# Patient Record
Sex: Male | Born: 1960 | Race: White | Hispanic: No | Smoking: Former smoker
Health system: Southern US, Community
[De-identification: ages and names within clinical notes are randomized; demographics above are authoritative.]

## PROBLEM LIST (undated history)

## (undated) DIAGNOSIS — B1920 Unspecified viral hepatitis C without hepatic coma: Secondary | ICD-10-CM

## (undated) DIAGNOSIS — T7840XA Allergy, unspecified, initial encounter: Secondary | ICD-10-CM

## (undated) DIAGNOSIS — G4730 Sleep apnea, unspecified: Secondary | ICD-10-CM

## (undated) DIAGNOSIS — G709 Myoneural disorder, unspecified: Secondary | ICD-10-CM

## (undated) HISTORY — DX: Sleep apnea, unspecified: G47.30

## (undated) HISTORY — PX: HAND SURGERY: SHX662

## (undated) HISTORY — DX: Unspecified viral hepatitis C without hepatic coma: B19.20

## (undated) HISTORY — DX: Allergy, unspecified, initial encounter: T78.40XA

## (undated) HISTORY — DX: Myoneural disorder, unspecified: G70.9

---

## 2000-03-02 ENCOUNTER — Encounter (HOSPITAL_COMMUNITY): Admission: RE | Admit: 2000-03-02 | Discharge: 2000-05-31 | Payer: Self-pay | Admitting: Neurology

## 2010-06-21 ENCOUNTER — Emergency Department (HOSPITAL_COMMUNITY): Admission: EM | Admit: 2010-06-21 | Discharge: 2010-06-21 | Payer: Self-pay | Admitting: Emergency Medicine

## 2011-09-15 ENCOUNTER — Other Ambulatory Visit: Payer: Self-pay | Admitting: Family Medicine

## 2011-09-15 DIAGNOSIS — R748 Abnormal levels of other serum enzymes: Secondary | ICD-10-CM

## 2011-09-23 ENCOUNTER — Ambulatory Visit
Admission: RE | Admit: 2011-09-23 | Discharge: 2011-09-23 | Disposition: A | Discharge status: Home / Self Care | Payer: Commercial Managed Care - PPO | Source: Ambulatory Visit | Attending: Family Medicine | Admitting: Family Medicine

## 2011-09-23 DIAGNOSIS — R748 Abnormal levels of other serum enzymes: Secondary | ICD-10-CM

## 2012-07-06 ENCOUNTER — Ambulatory Visit: Payer: Commercial Managed Care - PPO | Admitting: Gastroenterology

## 2020-11-10 ENCOUNTER — Ambulatory Visit: Payer: Self-pay | Admitting: Family Medicine

## 2021-01-08 ENCOUNTER — Ambulatory Visit: Payer: Commercial Managed Care - PPO | Admitting: Adult Health

## 2021-01-08 ENCOUNTER — Other Ambulatory Visit: Payer: Self-pay

## 2021-01-08 ENCOUNTER — Encounter: Payer: Self-pay | Admitting: Adult Health

## 2021-01-08 VITALS — BP 126/86 | HR 82 | Temp 98.0°F | Resp 16 | Ht 67.0 in | Wt 199.2 lb

## 2021-01-08 DIAGNOSIS — R202 Paresthesia of skin: Secondary | ICD-10-CM

## 2021-01-08 DIAGNOSIS — Z6831 Body mass index (BMI) 31.0-31.9, adult: Secondary | ICD-10-CM | POA: Diagnosis not present

## 2021-01-08 DIAGNOSIS — Z0000 Encounter for general adult medical examination without abnormal findings: Secondary | ICD-10-CM | POA: Insufficient documentation

## 2021-01-08 DIAGNOSIS — Z125 Encounter for screening for malignant neoplasm of prostate: Secondary | ICD-10-CM

## 2021-01-08 DIAGNOSIS — J302 Other seasonal allergic rhinitis: Secondary | ICD-10-CM | POA: Diagnosis not present

## 2021-01-08 DIAGNOSIS — R0683 Snoring: Secondary | ICD-10-CM | POA: Diagnosis not present

## 2021-01-08 DIAGNOSIS — E669 Obesity, unspecified: Secondary | ICD-10-CM | POA: Insufficient documentation

## 2021-01-08 MED ORDER — FLUTICASONE PROPIONATE 50 MCG/ACT NA SUSP: 2.0000 | Freq: Every day | NASAL | 6 refills | Status: DC

## 2021-01-08 MED ORDER — LEVOCETIRIZINE DIHYDROCHLORIDE 5 MG PO TABS: 5.0000 mg | ORAL_TABLET | Freq: Every evening | ORAL | 1 refills | Status: DC

## 2021-01-08 MED ORDER — AZELASTINE HCL 0.1 % NA SOLN: 2.0000 | Freq: Two times a day (BID) | NASAL | 12 refills | Status: DC

## 2021-01-08 NOTE — Progress Notes (Signed)
New patient visit   Patient: Gordon Moore   DOB: 12-31-1960   60 y.o. Male  MRN: [id number] Visit Date: 01/08/2021  Today's healthcare provider: Jairo Ben, FNP   Chief Complaint  Patient presents with  . New Patient (Initial Visit)   Subjective    Gordon Moore is a 60 y.o. male who presents today as a new patient to establish care.  HPI  Patient presents in office today to establish care he states that he feels fairly well today but does have concerns to address. Patient reports that he has concerns of a deviated septum and would like to discuss.   Patient states that he follows a general diet, he is not actively exercising at this time.   He feels he may have issues with deviated septum, says this has happened slowly over years. He may have had his nose broken as a young guy. He takes Claritin occasionally, Flonase occasionally.   Denies any recurrent sinus infections. He feels like in a " way I always have a cold" sinus congestion. Patient reports that his sleep habits are very poor, patient states that his girlfriend has told him multiple times that she has seen him stop breathing in the middle of the night, patient states that he has concerns of sleep apnea. Has low energy during the day. No unintentional sleep during the day.   Drinks caffeine - 4-5 cups daily.   Patient would also like to address today concerns of circulation in his lower extremities, patient states that if he sits down for a prolonged time he initially experiences pain when standing. After a few steps he starts walking normal. Denies any bone pain. He sleeps in all different positions.   He wants to loose weight.  He is taking Viagra, he reports he has had erectile dysfunction for years. He may want to see urologist later. He will let provider know.   History of smoking, quit one year ago. He does still vape, - nicotine.   In past he reports he was tested for possible MS, he saw a  neurologist.   Patient  denies any fever, body aches,chills, rash, chest pain, shortness of breath, nausea, vomiting, or diarrhea.  Denies dizziness, lightheadedness, pre syncopal or syncopal episodes.    Past Medical History:  Diagnosis Date  . Allergy    History reviewed. No pertinent surgical history. Family Status  Relation Name Status  . Mother  (Not Specified)  . Father  (Not Specified)   Family History  Problem Relation Age of Onset  . Other Mother        patient reported mother had hip replacement  . Other Father        patient wrote that father had kidney issues   Social History   Socioeconomic History  . Marital status: Married    Spouse name: Not on file  . Number of children: Not on file  . Years of education: Not on file  . Highest education level: Not on file  Occupational History  . Not on file  Tobacco Use  . Smoking status: Never Smoker  . Smokeless tobacco: Current User  Substance and Sexual Activity  . Alcohol use: Yes    Alcohol/week: 5.0 standard drinks    Types: 4 Glasses of wine, 1 Cans of beer per week  . Drug use: Never  . Sexual activity: Not on file  Other Topics Concern  . Not on file  Social History Narrative  . Not  on file   Social Determinants of Health   Financial Resource Strain: Not on file  Food Insecurity: Not on file  Transportation Needs: Not on file  Physical Activity: Not on file  Stress: Not on file  Social Connections: Not on file   No outpatient medications prior to visit.   No facility-administered medications prior to visit.   Allergies  Allergen Reactions  . Other     Patient reports seasonal allergies.     There is no immunization history on file for this patient.  Health Maintenance  Topic Date Due  . COVID-19 Vaccine (1) 01/24/2021 (Originally 05/05/1966)  . INFLUENZA VACCINE  02/24/2021 (Originally 05/25/2020)  . COLONOSCOPY (Pts 45-22yrs Insurance coverage will need to be confirmed)  01/08/2022  (Originally 05/05/2008)  . TETANUS/TDAP  01/08/2022 (Originally 05/05/1982)  . Hepatitis C Screening  01/08/2022 (Originally 13-Jan-1961)  . HIV Screening  01/08/2022 (Originally 05/05/1978)  . HPV VACCINES  Aged Out    Patient Care Team: Can Lucci, Eula Fried, FNP as PCP - General (Family Medicine)  Review of Systems  Constitutional: Negative for activity change, appetite change, chills, diaphoresis, fatigue, fever and unexpected weight change.  HENT: Positive for congestion, drooling, rhinorrhea, sinus pressure, sneezing and sore throat. Negative for dental problem, ear discharge, ear pain, facial swelling, hearing loss, mouth sores, nosebleeds, postnasal drip, sinus pain, tinnitus, trouble swallowing and voice change.   Eyes: Positive for discharge and itching.  Respiratory: Positive for apnea, shortness of breath and wheezing. Negative for cough, choking, chest tightness and stridor.   Cardiovascular: Negative.   Gastrointestinal: Positive for nausea. Negative for abdominal distention, abdominal pain, anal bleeding, blood in stool and constipation.  Genitourinary: Positive for frequency. Negative for decreased urine volume, difficulty urinating, dysuria, enuresis, flank pain, genital sores, hematuria, penile discharge, penile pain, penile swelling, scrotal swelling, testicular pain and urgency.  Musculoskeletal: Positive for myalgias. Negative for arthralgias, back pain, gait problem, joint swelling, neck pain and neck stiffness.  Skin: Negative.   Neurological: Positive for numbness. Negative for dizziness, tremors, seizures, syncope, facial asymmetry, speech difficulty, weakness, light-headedness (in bilateral legs at times when waking up or after sitting prolonged periods of time. resolves after being up a few seconds. denies any back pain or bone pain. ) and headaches.  Hematological: Negative.   Psychiatric/Behavioral: Negative.   All other systems reviewed and are negative.   Last  CBC No results found for: WBC, HGB, HCT, MCV, MCH, RDW, PLT Last metabolic panel No results found for: GLUCOSE, NA, K, CL, CO2, BUN, CREATININE, GFRNONAA, GFRAA, CALCIUM, PHOS, PROT, ALBUMIN, LABGLOB, AGRATIO, BILITOT, ALKPHOS, AST, ALT, ANIONGAP Last lipids No results found for: CHOL, HDL, LDLCALC, LDLDIRECT, TRIG, CHOLHDL Last hemoglobin A1c No results found for: HGBA1C Last thyroid functions No results found for: TSH, T3TOTAL, T4TOTAL, THYROIDAB Last vitamin D No results found for: 25OHVITD2, 25OHVITD3, VD25OH Last vitamin B12 and Folate No results found for: VITAMINB12, FOLATE    Objective    BP 126/86   Pulse 82   Temp 98 F (36.7 C) (Oral)   Resp 16   Ht 5\' 7"  (1.702 m)   Wt 199 lb 3.2 oz (90.4 kg)   SpO2 98%   BMI 31.20 kg/m  Physical Exam Vitals and nursing note reviewed.  Constitutional:      General: He is not in acute distress.    Appearance: Normal appearance. He is well-developed. He is not ill-appearing, toxic-appearing or diaphoretic.     Comments: Patient is alert and oriented and responsive  to questions Engages in eye contact with provider. Speaks in full sentences without any pauses without any shortness of breath or distress.    HENT:     Head: Normocephalic and atraumatic.     Right Ear: Hearing, tympanic membrane, ear canal and external ear normal.     Left Ear: Hearing, tympanic membrane, ear canal and external ear normal.     Nose: Nose normal.     Mouth/Throat:     Pharynx: Uvula midline. No oropharyngeal exudate.  Eyes:     General: Lids are normal. No scleral icterus.       Right eye: No discharge.        Left eye: No discharge.     Conjunctiva/sclera: Conjunctivae normal.     Pupils: Pupils are equal, round, and reactive to light.  Neck:     Thyroid: No thyromegaly.     Vascular: Normal carotid pulses. No carotid bruit, hepatojugular reflux or JVD.     Trachea: Trachea and phonation normal. No tracheal tenderness or tracheal deviation.      Meningeal: Brudzinski's sign absent.  Cardiovascular:     Rate and Rhythm: Normal rate and regular rhythm.     Pulses: Normal pulses.     Heart sounds: Normal heart sounds, S1 normal and S2 normal. Heart sounds not distant. No murmur heard. No friction rub. No gallop.   Pulmonary:     Effort: Pulmonary effort is normal. No accessory muscle usage or respiratory distress.     Breath sounds: Normal breath sounds. No stridor. No wheezing or rales.  Chest:     Chest wall: No tenderness.  Abdominal:     General: Bowel sounds are normal. There is no distension.     Palpations: Abdomen is soft. There is no mass.     Tenderness: There is no abdominal tenderness. There is no right CVA tenderness, left CVA tenderness, guarding or rebound.     Hernia: No hernia is present.  Musculoskeletal:        General: No tenderness or deformity. Normal range of motion.     Cervical back: Full passive range of motion without pain, normal range of motion and neck supple.     Comments: Patient moves on and off of exam table and in room without difficulty. Gait is normal in hall and in room. Patient is oriented to person place time and situation. Patient answers questions appropriately and engages in conversation.   Lymphadenopathy:     Head:     Right side of head: No submental, submandibular, tonsillar, preauricular, posterior auricular or occipital adenopathy.     Left side of head: No submental, submandibular, tonsillar, preauricular, posterior auricular or occipital adenopathy.     Cervical: No cervical adenopathy.  Skin:    General: Skin is warm and dry.     Capillary Refill: Capillary refill takes less than 2 seconds.     Coloration: Skin is not pale.     Findings: No erythema or rash.     Nails: There is no clubbing.  Neurological:     General: No focal deficit present.     Mental Status: He is alert and oriented to person, place, and time.     GCS: GCS eye subscore is 4. GCS verbal subscore is 5. GCS  motor subscore is 6.     Cranial Nerves: No cranial nerve deficit.     Sensory: No sensory deficit.     Motor: No weakness or abnormal muscle tone.  Coordination: Coordination normal.     Gait: Gait normal.     Deep Tendon Reflexes: Reflexes are normal and symmetric. Reflexes normal.  Psychiatric:        Mood and Affect: Mood normal.        Speech: Speech normal.        Behavior: Behavior normal.        Thought Content: Thought content normal.        Judgment: Judgment normal.     Depression Screen PHQ 2/9 Scores 01/08/2021  PHQ - 2 Score 1  PHQ- 9 Score 6   No results found for any visits on 01/08/21.  Assessment & Plan     Loud snoring - Plan: CBC with Differential/Platelet, Comprehensive metabolic panel, Lipid panel, TSH, Ambulatory referral to Sleep Studies, Ambulatory referral to ENT  Body mass index (BMI) of 31.0-31.9 in adult - Plan: CBC with Differential/Platelet, Comprehensive metabolic panel, Lipid panel, TSH, PSA  Paresthesia of bilateral legs - Plan: CBC with Differential/Platelet, Comprehensive metabolic panel, Lipid panel, PSA, VITAMIN D 25 Hydroxy (Vit-D Deficiency, Fractures), B12  Seasonal allergies - Plan: azelastine (ASTELIN) 0.1 % nasal spray, levocetirizine (XYZAL) 5 MG tablet, fluticasone (FLONASE) 50 MCG/ACT nasal spray, Ambulatory referral to ENT  Screening for prostate cancer - Plan: PSA  Recommend starting allergy medications dailiy as ordered. Call if questions or concerns. ENT referral given chronic.  . Orders Placed This Encounter  Procedures  . CBC with Differential/Platelet  . Comprehensive metabolic panel  . Lipid panel  . TSH  . PSA  . VITAMIN D 25 Hydroxy (Vit-D Deficiency, Fractures)  . B12  . Ambulatory referral to Sleep Studies  . Ambulatory referral to ENT   Wants to see Christia Reading in [facility] for ENT.   Return for fasting labs. He would like to return for Complete physical exam as well.   Referrals should call you within  two weeks and if you do not hear please call the office.  Red Flags discussed. The patient was given clear instructions to go to ER or return to medical center if any red flags develop, symptoms do not improve, worsen or new problems develop. They verbalized understanding.   Return in 3 months (on 04/10/2021), or if symptoms worsen or fail to improve, for at any time for any worsening symptoms, Go to Emergency room/ urgent care if worse.    The entirety of the information documented in the History of Present Illness, Review of Systems and Physical Exam were personally obtained by me. Portions of this information were initially documented by the CMA and reviewed by me for thoroughness and accuracy.    Jairo Ben, FNP  [facility] [phone] (phone) [phone] (fax)  [facility]

## 2021-01-08 NOTE — Patient Instructions (Addendum)
Xyzal  Flonase nasal spray.    Meds ordered this encounter  Medications  . azelastine (ASTELIN) 0.1 % nasal spray    Sig: Place 2 sprays into both nostrils 2 (two) times daily. Use in each nostril as directed    Dispense:  30 mL    Refill:  12  . levocetirizine (XYZAL) 5 MG tablet    Sig: Take 1 tablet (5 mg total) by mouth every evening.    Dispense:  90 tablet    Refill:  1  . fluticasone (FLONASE) 50 MCG/ACT nasal spray    Sig: Place 2 sprays into both nostrils daily.    Dispense:  16 g    Refill:  6   Health Maintenance, Male Adopting a healthy lifestyle and getting preventive care are important in promoting health and wellness. Ask your health care provider about:  The right schedule for you to have regular tests and exams.  Things you can do on your own to prevent diseases and keep yourself healthy. What should I know about diet, weight, and exercise? Eat a healthy diet  Eat a diet that includes plenty of vegetables, fruits, low-fat dairy products, and lean protein.  Do not eat a lot of foods that are high in solid fats, added sugars, or sodium.   Maintain a healthy weight Body mass index (BMI) is a measurement that can be used to identify possible weight problems. It estimates body fat based on height and weight. Your health care provider can help determine your BMI and help you achieve or maintain a healthy weight. Get regular exercise Get regular exercise. This is one of the most important things you can do for your health. Most adults should:  Exercise for at least 150 minutes each week. The exercise should increase your heart rate and make you sweat (moderate-intensity exercise).  Do strengthening exercises at least twice a week. This is in addition to the moderate-intensity exercise.  Spend less time sitting. Even light physical activity can be beneficial. Watch cholesterol and blood lipids Have your blood tested for lipids and cholesterol at 58 years of age,  then have this test every 5 years. You may need to have your cholesterol levels checked more often if:  Your lipid or cholesterol levels are high.  You are older than 58 years of age.  You are at high risk for heart disease. What should I know about cancer screening? Many types of cancers can be detected early and may often be prevented. Depending on your health history and family history, you may need to have cancer screening at various ages. This may include screening for:  Colorectal cancer.  Prostate cancer.  Skin cancer.  Lung cancer. What should I know about heart disease, diabetes, and high blood pressure? Blood pressure and heart disease  High blood pressure causes heart disease and increases the risk of stroke. This is more likely to develop in people who have high blood pressure readings, are of African descent, or are overweight.  Talk with your health care provider about your target blood pressure readings.  Have your blood pressure checked: ? Every 3-5 years if you are 45-51 years of age. ? Every year if you are 36 years old or older.  If you are between the ages of 37 and 77 and are a current or former smoker, ask your health care provider if you should have a one-time screening for abdominal aortic aneurysm (AAA). Diabetes Have regular diabetes screenings. This checks your fasting blood  sugar level. Have the screening done:  Once every three years after age 45 if you are at a normal weight and have a low risk for diabetes.  More often and at a younger age if you are overweight or have a high risk for diabetes. What should I know about preventing infection? Hepatitis B If you have a higher risk for hepatitis B, you should be screened for this virus. Talk with your health care provider to find out if you are at risk for hepatitis B infection. Hepatitis C Blood testing is recommended for:  Everyone born from [birth year] through 1963.  Anyone with known risk factors  for hepatitis C. Sexually transmitted infections (STIs)  You should be screened each year for STIs, including gonorrhea and chlamydia, if: ? You are sexually active and are younger than 58 years of age. ? You are older than 58 years of age and your health care provider tells you that you are at risk for this type of infection. ? Your sexual activity has changed since you were last screened, and you are at increased risk for chlamydia or gonorrhea. Ask your health care provider if you are at risk.  Ask your health care provider about whether you are at high risk for HIV. Your health care provider may recommend a prescription medicine to help prevent HIV infection. If you choose to take medicine to prevent HIV, you should first get tested for HIV. You should then be tested every 3 months for as long as you are taking the medicine. Follow these instructions at home: Lifestyle  Do not use any products that contain nicotine or tobacco, such as cigarettes, e-cigarettes, and chewing tobacco. If you need help quitting, ask your health care provider.  Do not use street drugs.  Do not share needles.  Ask your health care provider for help if you need support or information about quitting drugs. Alcohol use  Do not drink alcohol if your health care provider tells you not to drink.  If you drink alcohol: ? Limit how much you have to 0-2 drinks a day. ? Be aware of how much alcohol is in your drink. In the [place] one drink equals one 12 oz bottle of beer (355 mL), one 5 oz glass of wine (148 mL), or one 1 oz glass of hard liquor (44 mL). General instructions  Schedule regular health, dental, and eye exams.  Stay current with your vaccines.  Tell your health care provider if: ? You often feel depressed. ? You have ever been abused or do not feel safe at home. Summary  Adopting a healthy lifestyle and getting preventive care are important in promoting health and wellness.  Follow your health  care provider's instructions about healthy diet, exercising, and getting tested or screened for diseases.  Follow your health care provider's instructions on monitoring your cholesterol and blood pressure. This information is not intended to replace advice given to you by your health care provider. Make sure you discuss any questions you have with your health care provider. Document Revised: 10/04/2018 Document Reviewed: 10/04/2018 Elsevier Patient Education  2021 [employer] Inc.  Calorie Counting for [employer] Calories are units of energy. Your body needs a certain number of calories from food to keep going throughout the day. When you eat or drink more calories than your body needs, your body stores the extra calories mostly as fat. When you eat or drink fewer calories than your body needs, your body burns fat to get the  energy it needs. Calorie counting means keeping track of how many calories you eat and drink each day. Calorie counting can be helpful if you need to lose weight. If you eat fewer calories than your body needs, you should lose weight. Ask your health care provider what a healthy weight is for you. For calorie counting to work, you will need to eat the right number of calories each day to lose a healthy amount of weight per week. A dietitian can help you figure out how many calories you need in a day and will suggest ways to reach your calorie goal.  A healthy amount of weight to lose each week is usually 1-2 lb (0.5-0.9 kg). This usually means that your daily calorie intake should be reduced by 500-750 calories.  Eating 1,200-1,500 calories a day can help most women lose weight.  Eating 1,500-1,800 calories a day can help most men lose weight. What do I need to know about calorie counting? Work with your health care provider or dietitian to determine how many calories you should get each day. To meet your daily calorie goal, you will need to:  Find out how many calories are in  each food that you would like to eat. Try to do this before you eat.  Decide how much of the food you plan to eat.  Keep a food log. Do this by writing down what you ate and how many calories it had. To successfully lose weight, it is important to balance calorie counting with a healthy lifestyle that includes regular activity. Where do I find calorie information? The number of calories in a food can be found on a Nutrition Facts label. If a food does not have a Nutrition Facts label, try to look up the calories online or ask your dietitian for help. Remember that calories are listed per serving. If you choose to have more than one serving of a food, you will have to multiply the calories per serving by the number of servings you plan to eat. For example, the label on a package of bread might say that a serving size is 1 slice and that there are 90 calories in a serving. If you eat 1 slice, you will have eaten 90 calories. If you eat 2 slices, you will have eaten 180 calories.   How do I keep a food log? After each time that you eat, record the following in your food log as soon as possible:  What you ate. Be sure to include toppings, sauces, and other extras on the food.  How much you ate. This can be measured in cups, ounces, or number of items.  How many calories were in each food and drink.  The total number of calories in the food you ate. Keep your food log near you, such as in a pocket-sized notebook or on an app or website on your mobile phone. Some programs will calculate calories for you and show you how many calories you have left to meet your daily goal. What are some portion-control tips?  Know how many calories are in a serving. This will help you know how many servings you can have of a certain food.  Use a measuring cup to measure serving sizes. You could also try weighing out portions on a kitchen scale. With time, you will be able to estimate serving sizes for some  foods.  Take time to put servings of different foods on your favorite plates or in your favorite  bowls and cups so you know what a serving looks like.  Try not to eat straight from a food's packaging, such as from a bag or box. Eating straight from the package makes it hard to see how much you are eating and can lead to overeating. Put the amount you would like to eat in a cup or on a plate to make sure you are eating the right portion.  Use smaller plates, glasses, and bowls for smaller portions and to prevent overeating.  Try not to multitask. For example, avoid watching TV or using your computer while eating. If it is time to eat, sit down at a table and enjoy your food. This will help you recognize when you are full. It will also help you be more mindful of what and how much you are eating. What are tips for following this plan? Reading food labels  Check the calorie count compared with the serving size. The serving size may be smaller than what you are used to eating.  Check the source of the calories. Try to choose foods that are high in protein, fiber, and vitamins, and low in saturated fat, trans fat, and sodium. Shopping  Read nutrition labels while you shop. This will help you make healthy decisions about which foods to buy.  Pay attention to nutrition labels for low-fat or fat-free foods. These foods sometimes have the same number of calories or more calories than the full-fat versions. They also often have added sugar, starch, or salt to make up for flavor that was removed with the fat.  Make a grocery list of lower-calorie foods and stick to it. Cooking  Try to cook your favorite foods in a healthier way. For example, try baking instead of frying.  Use low-fat dairy products. Meal planning  Use more fruits and vegetables. One-half of your plate should be fruits and vegetables.  Include lean proteins, such as chicken, [place], and fish. Lifestyle Each week, aim to do one  of the following:  150 minutes of moderate exercise, such as walking.  75 minutes of vigorous exercise, such as running. General information  Know how many calories are in the foods you eat most often. This will help you calculate calorie counts faster.  Find a way of tracking calories that works for you. Get creative. Try different apps or programs if writing down calories does not work for you. What foods should I eat?  Eat nutritious foods. It is better to have a nutritious, high-calorie food, such as an avocado, than a food with few nutrients, such as a bag of potato chips.  Use your calories on foods and drinks that will fill you up and will not leave you hungry soon after eating. ? Examples of foods that fill you up are nuts and nut butters, vegetables, lean proteins, and high-fiber foods such as whole grains. High-fiber foods are foods with more than 5 g of fiber per serving.  Pay attention to calories in drinks. Low-calorie drinks include water and unsweetened drinks. The items listed above may not be a complete list of foods and beverages you can eat. Contact a dietitian for more information.   What foods should I limit? Limit foods or drinks that are not good sources of vitamins, minerals, or protein or that are high in unhealthy fats. These include:  Candy.  Other sweets.  Sodas, specialty coffee drinks, alcohol, and juice. The items listed above may not be a complete list of foods and beverages you  should avoid. Contact a dietitian for more information. How do I count calories when eating out?  Pay attention to portions. Often, portions are much larger when eating out. Try these tips to keep portions smaller: ? Consider sharing a meal instead of getting your own. ? If you get your own meal, eat only half of it. Before you start eating, ask for a container and put half of your meal into it. ? When available, consider ordering smaller portions from the menu instead of full  portions.  Pay attention to your food and drink choices. Knowing the way food is cooked and what is included with the meal can help you eat fewer calories. ? If calories are listed on the menu, choose the lower-calorie options. ? Choose dishes that include vegetables, fruits, whole grains, low-fat dairy products, and lean proteins. ? Choose items that are boiled, broiled, grilled, or steamed. Avoid items that are buttered, battered, fried, or served with cream sauce. Items labeled as crispy are usually fried, unless stated otherwise. ? Choose water, low-fat milk, unsweetened iced tea, or other drinks without added sugar. If you want an alcoholic beverage, choose a lower-calorie option, such as a glass of wine or light beer. ? Ask for dressings, sauces, and syrups on the side. These are usually high in calories, so you should limit the amount you eat. ? If you want a salad, choose a garden salad and ask for grilled meats. Avoid extra toppings such as bacon, cheese, or fried items. Ask for the dressing on the side, or ask for olive oil and vinegar or lemon to use as dressing.  Estimate how many servings of a food you are given. Knowing serving sizes will help you be aware of how much food you are eating at restaurants. Where to find more information  Centers for Disease Control and Prevention: [employer]Ex[employer].com.br  [place] Department of Agriculture: [employer].dk Summary  Calorie counting means keeping track of how many calories you eat and drink each day. If you eat fewer calories than your body needs, you should lose weight.  A healthy amount of weight to lose per week is usually 1-2 lb (0.5-0.9 kg). This usually means reducing your daily calorie intake by 500-750 calories.  The number of calories in a food can be found on a Nutrition Facts label. If a food does not have a Nutrition Facts label, try to look up the calories online or ask your dietitian for help.  Use smaller plates, glasses, and bowls for  smaller portions and to prevent overeating.  Use your calories on foods and drinks that will fill you up and not leave you hungry shortly after a meal. This information is not intended to replace advice given to you by your health care provider. Make sure you discuss any questions you have with your health care provider. Document Revised: 11/22/2019 Document Reviewed: 11/22/2019 Elsevier Patient Education  2021 Else[employer] Inc.   Fat and Cholesterol Restricted Eating Plan Getting too much fat and cholesterol in your diet may cause health problems. Choosing the right foods helps keep your fat and cholesterol at normal levels. This can keep you from getting certain diseases. Your doctor may recommend an eating plan that includes:  Total fat: ______% or less of total calories a day.  Saturated fat: ______% or less of total calories a day.  Cholesterol: less than _________mg a day.  Fiber: ______g a day. What are tips for following this plan? Meal planning  At meals, divide your plate  into four equal parts: ? Fill one-half of your plate with vegetables and green salads. ? Fill one-fourth of your plate with whole grains. ? Fill one-fourth of your plate with low-fat (lean) protein foods.  Eat fish that is high in omega-3 fats at least two times a week. This includes mackerel, tuna, sardines, and salmon.  Eat foods that are high in fiber, such as whole grains, beans, apples, broccoli, carrots, peas, and barley. General tips  Work with your doctor to lose weight if you need to.  Avoid: ? Foods with added sugar. ? Fried foods. ? Foods with partially hydrogenated oils.  Limit alcohol intake to no more than 1 drink a day for nonpregnant women and 2 drinks a day for men. One drink equals 12 oz of beer, 5 oz of wine, or 1 oz of hard liquor.   Reading food labels  Check food labels for: ? Trans fats. ? Partially hydrogenated oils. ? Saturated fat (g) in each serving. ? Cholesterol (mg)  in each serving. ? Fiber (g) in each serving.  Choose foods with healthy fats, such as: ? Monounsaturated fats. ? Polyunsaturated fats. ? Omega-3 fats.  Choose grain products that have whole grains. Look for the word "whole" as the first word in the ingredient list. Cooking  Cook foods using low-fat methods. These include baking, boiling, grilling, and broiling.  Eat more home-cooked foods. Eat at restaurants and buffets less often.  Avoid cooking using saturated fats, such as butter, cream, palm oil, palm kernel oil, and coconut oil. Recommended foods Fruits  All fresh, canned (in natural juice), or frozen fruits. Vegetables  Fresh or frozen vegetables (raw, steamed, roasted, or grilled). Green salads. Grains  Whole grains, such as whole wheat or whole grain breads, crackers, cereals, and pasta. Unsweetened oatmeal, bulgur, barley, quinoa, or brown rice. Corn or whole wheat flour tortillas. Meats and other protein foods  Ground beef (85% or leaner), grass-fed beef, or beef trimmed of fat. Skinless chicken or [place]. Ground chicken or [place]. Pork trimmed of fat. All fish and seafood. Egg whites. Dried beans, peas, or lentils. Unsalted nuts or seeds. Unsalted canned beans. Nut butters without added sugar or oil. Dairy  Low-fat or nonfat dairy products, such as skim or 1% milk, 2% or reduced-fat cheeses, low-fat and fat-free ricotta or cottage cheese, or plain low-fat and nonfat yogurt. Fats and oils  Tub margarine without trans fats. Light or reduced-fat mayonnaise and salad dressings. Avocado. Olive, canola, sesame, or safflower oils. The items listed above may not be a complete list of foods and beverages you can eat. Contact a dietitian for more information.   Foods to avoid Fruits  Canned fruit in heavy syrup. Fruit in cream or butter sauce. Fried fruit. Vegetables  Vegetables cooked in cheese, cream, or butter sauce. Fried vegetables. Grains  White bread. White  pasta. White rice. Cornbread. Bagels, pastries, and croissants. Crackers and snack foods that contain trans fat and hydrogenated oils. Meats and other protein foods  Fatty cuts of meat. Ribs, chicken wings, bacon, sausage, bologna, salami, chitterlings, fatback, hot dogs, bratwurst, and packaged lunch meats. Liver and organ meats. Whole eggs and egg yolks. Chicken and [place] with skin. Fried meat. Dairy  Whole or 2% milk, cream, half-and-half, and cream cheese. Whole milk cheeses. Whole-fat or sweetened yogurt. Full-fat cheeses. Nondairy creamers and whipped toppings. Processed cheese, cheese spreads, and cheese curds. Beverages  Alcohol. Sugar-sweetened drinks such as sodas, lemonade, and fruit drinks. Fats and oils  Butter, stick  margarine, lard, shortening, ghee, or bacon fat. Coconut, palm kernel, and palm oils. Sweets and desserts  Corn syrup, sugars, honey, and molasses. Candy. Jam and jelly. Syrup. Sweetened cereals. Cookies, pies, cakes, donuts, muffins, and ice cream. The items listed above may not be a complete list of foods and beverages you should avoid. Contact a dietitian for more information. Summary  Choosing the right foods helps keep your fat and cholesterol at normal levels. This can keep you from getting certain diseases.  At meals, fill one-half of your plate with vegetables and green salads.  Eat high-fiber foods, like whole grains, beans, apples, carrots, peas, and barley.  Limit added sugar, saturated fats, alcohol, and fried foods. This information is not intended to replace advice given to you by your health care provider. Make sure you discuss any questions you have with your health care provider. Document Revised: 02/13/2020 Document Reviewed: 02/13/2020 Elsevier Patient Education  2021 [employer].

## 2021-01-30 DIAGNOSIS — J342 Deviated nasal septum: Secondary | ICD-10-CM | POA: Insufficient documentation

## 2021-01-30 DIAGNOSIS — J343 Hypertrophy of nasal turbinates: Secondary | ICD-10-CM | POA: Insufficient documentation

## 2021-01-30 DIAGNOSIS — G4733 Obstructive sleep apnea (adult) (pediatric): Secondary | ICD-10-CM | POA: Insufficient documentation

## 2021-01-30 DIAGNOSIS — H6122 Impacted cerumen, left ear: Secondary | ICD-10-CM | POA: Insufficient documentation

## 2021-02-11 ENCOUNTER — Encounter: Payer: Self-pay | Admitting: Adult Health

## 2021-02-11 ENCOUNTER — Ambulatory Visit (INDEPENDENT_AMBULATORY_CARE_PROVIDER_SITE_OTHER): Payer: Commercial Managed Care - PPO | Admitting: Adult Health

## 2021-02-11 ENCOUNTER — Other Ambulatory Visit: Payer: Self-pay

## 2021-02-11 VITALS — BP 119/80 | HR 88 | Ht 67.0 in | Wt 201.0 lb

## 2021-02-11 DIAGNOSIS — G4733 Obstructive sleep apnea (adult) (pediatric): Secondary | ICD-10-CM | POA: Diagnosis not present

## 2021-02-11 DIAGNOSIS — J342 Deviated nasal septum: Secondary | ICD-10-CM

## 2021-02-11 DIAGNOSIS — Z1389 Encounter for screening for other disorder: Secondary | ICD-10-CM

## 2021-02-11 DIAGNOSIS — Z6831 Body mass index (BMI) 31.0-31.9, adult: Secondary | ICD-10-CM

## 2021-02-11 DIAGNOSIS — J302 Other seasonal allergic rhinitis: Secondary | ICD-10-CM

## 2021-02-11 DIAGNOSIS — Z1211 Encounter for screening for malignant neoplasm of colon: Secondary | ICD-10-CM

## 2021-02-11 LAB — POCT URINALYSIS DIPSTICK
Appearance: NORMAL
Bilirubin, UA: NEGATIVE
Blood, UA: NEGATIVE
Glucose, UA: NEGATIVE
Ketones, UA: NEGATIVE
Leukocytes, UA: NEGATIVE
Nitrite, UA: NEGATIVE
Odor: NORMAL
Protein, UA: NEGATIVE
Spec Grav, UA: 1.025 (ref 1.010–1.025)
Urobilinogen, UA: 0.2 E.U./dL
pH, UA: 6 (ref 5.0–8.0)

## 2021-02-11 MED ORDER — TADALAFIL 2.5 MG PO TABS: 1.0000 | ORAL_TABLET | Freq: Every day | ORAL | 3 refills | Status: DC

## 2021-02-11 NOTE — Patient Instructions (Addendum)
[facility]  [address] [place], [place] [id number]  [place]: [phone] Fax: [phone] Hours (M-F): 8am - 5pm  Tadalafil tablets (Cialis) What is this medicine? TADALAFIL (tah DA la fil) is used to treat erection problems in men. It is also used for enlargement of the prostate gland in men, a condition called benign prostatic hyperplasia or BPH. This medicine improves urine flow and reduces BPH symptoms. This medicine can also treat both erection problems and BPH when they occur together. This medicine may be used for other purposes; ask your health care provider or pharmacist if you have questions. COMMON BRAND NAME(S): Mady GemmaAdcirca, ALYQ, Cialis What should I tell my health care provider before I take this medicine? They need to know if you have any of these conditions:  bleeding disorders  eye or vision problems, including a rare inherited eye disease called retinitis pigmentosa  anatomical deformation of the penis, Peyronie's disease, or history of priapism (painful and prolonged erection)  heart disease, angina, a history of heart attack, irregular heart beats, or other heart problems  high or low blood pressure  history of blood diseases, like sickle cell anemia or leukemia  history of stomach bleeding  kidney disease  liver disease  stroke  an unusual or allergic reaction to tadalafil, other medicines, foods, dyes, or preservatives  pregnant or trying to get pregnant  breast-feeding How should I use this medicine? Take this medicine by mouth with a glass of water. Follow the directions on the prescription label. You may take this medicine with or without meals. When this medicine is used for erection problems, your doctor may prescribe it to be taken once daily or as needed. If you are taking the medicine as needed, you may be able to have sexual activity 30 minutes after taking it and for up to 36 hours after taking it. Whether you  are taking the medicine as needed or once daily, you should not take more than one dose per day. If you are taking this medicine for symptoms of benign prostatic hyperplasia (BPH) or to treat both BPH and an erection problem, take the dose once daily at about the same time each day. Do not take your medicine more often than directed. Talk to your pediatrician regarding the use of this medicine in children. Special care may be needed. Overdosage: If you think you have taken too much of this medicine contact a poison control center or emergency room at once. NOTE: This medicine is only for you. Do not share this medicine with others. What if I miss a dose? If you are taking this medicine as needed for erection problems, this does not apply. If you miss a dose while taking this medicine once daily for an erection problem, benign prostatic hyperplasia, or both, take it as soon as you remember, but do not take more than one dose per day. What may interact with this medicine? Do not take this medicine with any of the following medications:  nitrates like amyl nitrite, isosorbide dinitrate, isosorbide mononitrate, nitroglycerin  other medicines for erectile dysfunction like avanafil, sildenafil, vardenafil  other tadalafil products (Adcirca)  riociguat This medicine may also interact with the following medications:  certain drugs for high blood pressure  certain drugs for the treatment of HIV infection or AIDS  certain drugs used for fungal or yeast infections, like fluconazole, itraconazole, ketoconazole, and voriconazole  certain drugs used for seizures like carbamazepine, phenytoin, and phenobarbital  grapefruit juice  macrolide antibiotics  like clarithromycin, erythromycin, troleandomycin  medicines for prostate problems  rifabutin, rifampin or rifapentine This list may not describe all possible interactions. Give your health care provider a list of all the medicines, herbs,  non-prescription drugs, or dietary supplements you use. Also tell them if you smoke, drink alcohol, or use illegal drugs. Some items may interact with your medicine. What should I watch for while using this medicine? If you notice any changes in your vision while taking this drug, call your doctor or health care professional as soon as possible. Stop using this medicine and call your health care provider right away if you have a loss of sight in one or both eyes. Contact your doctor or health care professional right away if the erection lasts longer than 4 hours or if it becomes painful. This may be a sign of serious problem and must be treated right away to prevent permanent damage. If you experience symptoms of nausea, dizziness, chest pain or arm pain upon initiation of sexual activity after taking this medicine, you should refrain from further activity and call your doctor or health care professional as soon as possible. Do not drink alcohol to excess (examples, 5 glasses of wine or 5 shots of whiskey) when taking this medicine. When taken in excess, alcohol can increase your chances of getting a headache or getting dizzy, increasing your heart rate or lowering your blood pressure. Using this medicine does not protect you or your partner against HIV infection (the virus that causes AIDS) or other sexually transmitted diseases. What side effects may I notice from receiving this medicine? Side effects that you should report to your doctor or health care professional as soon as possible:  allergic reactions like skin rash, itching or hives, swelling of the face, lips, or tongue  breathing problems  changes in hearing  changes in vision  chest pain  fast, irregular heartbeat  prolonged or painful erection  seizures Side effects that usually do not require medical attention (report to your doctor or health care professional if they continue or are bothersome):  back  pain  dizziness  flushing  headache  indigestion  muscle aches  nausea  stuffy or runny nose This list may not describe all possible side effects. Call your doctor for medical advice about side effects. You may report side effects to FDA at 1-800-FDA-1088. Where should I keep my medicine? Keep out of the reach of children. Store at room temperature between 15 and 30 degrees C (59 and 86 degrees F). Throw away any unused medicine after the expiration date. NOTE: This sheet is a summary. It may not cover all possible information. If you have questions about this medicine, talk to your doctor, pharmacist, or health care provider.  2021 Elsevier/Gold Standard (2014-03-01 13:15:49)  Erectile Dysfunction Erectile dysfunction (ED) is the inability to get or keep an erection in order to have sexual intercourse. ED is considered a symptom of an underlying disorder and not considered a disease. Erectile dysfunction may include:  Inability to get an erection.  Lack of enough hardness of the erection to allow penetration.  Loss of the erection before sex is finished. What are the causes? This condition may be caused by:  Certain medicines, such as: ? Pain relievers. ? Antihistamines. ? Antidepressants. ? Blood pressure medicines. ? Water pills (diuretics). ? Ulcer medicines. ? Muscle relaxants. ? Drugs.  Excessive drinking.  Psychological causes, such as: ? Anxiety. ? Depression. ? Sadness. ? Exhaustion. ? Performance fear. ? Stress.  Physical causes, such as: ? Artery problems. This may include diabetes, smoking, liver disease, or atherosclerosis. ? High blood pressure. ? Hormonal problems, such as low testosterone. ? Obesity. ? Nerve problems. This may include back or pelvic injuries, diabetes mellitus, multiple sclerosis, or Parkinson's disease. What are the signs or symptoms? Symptoms of this condition include:  Inability to get an erection.  Lack of enough  hardness of the erection to allow penetration.  Loss of the erection before sex is finished.  Normal erections at some times, but with frequent unsatisfactory episodes.  Low sexual satisfaction in either partner due to erection problems.  A curved penis occurring with erection. The curve may cause pain or the penis may be too curved to allow for intercourse.  Never having nighttime erections. How is this diagnosed? This condition is often diagnosed by:  Performing a physical exam to find other diseases or specific problems with the penis.  Asking you detailed questions about the problem.  Performing blood tests to check for diabetes mellitus or to measure hormone levels.  Performing other tests to check for underlying health conditions.  Performing an ultrasound exam to check for scarring.  Performing a test to check blood flow to the penis.  Doing a sleep study at home to measure nighttime erections. How is this treated? This condition may be treated by:  Medicine taken by mouth to help you achieve an erection (oral medicine).  Hormone replacement therapy to replace low testosterone levels.  Medicine that is injected into the penis. Your health care provider may instruct you how to give yourself these injections at home.  Vacuum pump. This is a pump with a ring on it. The pump and ring are placed on the penis and used to create pressure that helps the penis become erect.  Penile implant surgery. In this procedure, you may receive: ? An inflatable implant. This consists of cylinders, a pump, and a reservoir. The cylinders can be inflated with a fluid that helps to create an erection, and they can be deflated after intercourse. ? A semi-rigid implant. This consists of two silicone rubber rods. The rods provide some rigidity. They are also flexible, so the penis can both curve downward in its normal position and become straight for sexual intercourse.  Blood vessel surgery, to  improve blood flow to the penis. During this procedure, a blood vessel from a different part of the body is placed into the penis to allow blood to flow around (bypass) damaged or blocked blood vessels.  Lifestyle changes, such as exercising more, losing weight, and quitting smoking. Follow these instructions at home: Medicines  Take over-the-counter and prescription medicines only as told by your health care provider. Do not increase the dosage without first discussing it with your health care provider.  If you are using self-injections, perform injections as directed by your health care provider. Make sure to avoid any veins that are on the surface of the penis. After giving an injection, apply pressure to the injection site for 5 minutes.   General instructions  Exercise regularly, as directed by your health care provider. Work with your health care provider to lose weight, if needed.  Do not use any products that contain nicotine or tobacco, such as cigarettes and e-cigarettes. If you need help quitting, ask your health care provider.  Before using a vacuum pump, read the instructions that come with the pump and discuss any questions with your health care provider.  Keep all follow-up visits as  told by your health care provider. This is important. Contact a health care provider if:  You feel nauseous.  You vomit. Get help right away if:  You are taking oral or injectable medicines and you have an erection that lasts longer than 4 hours. If your health care provider is unavailable, go to the nearest emergency room for evaluation. An erection that lasts much longer than 4 hours can result in permanent damage to your penis.  You have severe pain in your groin or abdomen.  You develop redness or severe swelling of your penis.  You have redness spreading up into your groin or lower abdomen.  You are unable to urinate.  You experience chest pain or a rapid heart beat (palpitations)  after taking oral medicines. Summary  Erectile dysfunction (ED) is the inability to get or keep an erection during sexual intercourse. This problem can usually be treated successfully.  This condition is diagnosed based on a physical exam, your symptoms, and tests to determine the cause. Treatment varies depending on the cause and may include medicines, hormone therapy, surgery, or a vacuum pump.  You may need follow-up visits to make sure that you are using your medicines or devices correctly.  Get help right away if you are taking or injecting medicines and you have an erection that lasts longer than 4 hours. This information is not intended to replace advice given to you by your health care provider. Make sure you discuss any questions you have with your health care provider. Document Revised: 06/27/2020 Document Reviewed: 06/27/2020 Elsevier Patient Education  2021 Elsevier Inc.  Health Maintenance, Male Adopting a healthy lifestyle and getting preventive care are important in promoting health and wellness. Ask your health care provider about:  The right schedule for you to have regular tests and exams.  Things you can do on your own to prevent diseases and keep yourself healthy. What should I know about diet, weight, and exercise? Eat a healthy diet  Eat a diet that includes plenty of vegetables, fruits, low-fat dairy products, and lean protein.  Do not eat a lot of foods that are high in solid fats, added sugars, or sodium.   Maintain a healthy weight Body mass index (BMI) is a measurement that can be used to identify possible weight problems. It estimates body fat based on height and weight. Your health care provider can help determine your BMI and help you achieve or maintain a healthy weight. Get regular exercise Get regular exercise. This is one of the most important things you can do for your health. Most adults should:  Exercise for at least 150 minutes each week. The  exercise should increase your heart rate and make you sweat (moderate-intensity exercise).  Do strengthening exercises at least twice a week. This is in addition to the moderate-intensity exercise.  Spend less time sitting. Even light physical activity can be beneficial. Watch cholesterol and blood lipids Have your blood tested for lipids and cholesterol at 58 years of age, then have this test every 5 years. You may need to have your cholesterol levels checked more often if:  Your lipid or cholesterol levels are high.  You are older than 58 years of age.  You are at high risk for heart disease. What should I know about cancer screening? Many types of cancers can be detected early and may often be prevented. Depending on your health history and family history, you may need to have cancer screening at various ages. This may  include screening for:  Colorectal cancer.  Prostate cancer.  Skin cancer.  Lung cancer. What should I know about heart disease, diabetes, and high blood pressure? Blood pressure and heart disease  High blood pressure causes heart disease and increases the risk of stroke. This is more likely to develop in people who have high blood pressure readings, are of African descent, or are overweight.  Talk with your health care provider about your target blood pressure readings.  Have your blood pressure checked: ? Every 3-5 years if you are 16-62 years of age. ? Every year if you are 40 years old or older.  If you are between the ages of 40 and 27 and are a current or former smoker, ask your health care provider if you should have a one-time screening for abdominal aortic aneurysm (AAA). Diabetes Have regular diabetes screenings. This checks your fasting blood sugar level. Have the screening done:  Once every three years after age 42 if you are at a normal weight and have a low risk for diabetes.  More often and at a younger age if you are overweight or have a high  risk for diabetes. What should I know about preventing infection? Hepatitis B If you have a higher risk for hepatitis B, you should be screened for this virus. Talk with your health care provider to find out if you are at risk for hepatitis B infection. Hepatitis C Blood testing is recommended for:  Everyone born from [birth year] through 1963.  Anyone with known risk factors for hepatitis C. Sexually transmitted infections (STIs)  You should be screened each year for STIs, including gonorrhea and chlamydia, if: ? You are sexually active and are younger than 58 years of age. ? You are older than 58 years of age and your health care provider tells you that you are at risk for this type of infection. ? Your sexual activity has changed since you were last screened, and you are at increased risk for chlamydia or gonorrhea. Ask your health care provider if you are at risk.  Ask your health care provider about whether you are at high risk for HIV. Your health care provider may recommend a prescription medicine to help prevent HIV infection. If you choose to take medicine to prevent HIV, you should first get tested for HIV. You should then be tested every 3 months for as long as you are taking the medicine. Follow these instructions at home: Lifestyle  Do not use any products that contain nicotine or tobacco, such as cigarettes, e-cigarettes, and chewing tobacco. If you need help quitting, ask your health care provider.  Do not use street drugs.  Do not share needles.  Ask your health care provider for help if you need support or information about quitting drugs. Alcohol use  Do not drink alcohol if your health care provider tells you not to drink.  If you drink alcohol: ? Limit how much you have to 0-2 drinks a day. ? Be aware of how much alcohol is in your drink. In the [place] one drink equals one 12 oz bottle of beer (355 mL), one 5 oz glass of wine (148 mL), or one 1 oz glass of hard liquor  (44 mL). General instructions  Schedule regular health, dental, and eye exams.  Stay current with your vaccines.  Tell your health care provider if: ? You often feel depressed. ? You have ever been abused or do not feel safe at home. Summary  Adopting a  healthy lifestyle and getting preventive care are important in promoting health and wellness.  Follow your health care provider's instructions about healthy diet, exercising, and getting tested or screened for diseases.  Follow your health care provider's instructions on monitoring your cholesterol and blood pressure. This information is not intended to replace advice given to you by your health care provider. Make sure you discuss any questions you have with your health care provider. Document Revised: 10/04/2018 Document Reviewed: 10/04/2018 Elsevier Patient Education  2021 [employer].

## 2021-02-11 NOTE — Progress Notes (Signed)
Complete physical exam   Patient: Gordon Moore   DOB: Apr 05, 1961   60 y.o. Male  MRN: [id number] Visit Date: 02/11/2021  Today's healthcare provider: Jairo BenMichelle Smith Franziska Podgurski, FNP   Chief Complaint  Patient presents with  . Annual Exam   Subjective    Gordon Moore is a 60 y.o. male who presents today for a complete physical exam.  He reports consuming a general diet. Patient sometimes exercises and not consistently. He generally feels well. He reports sleeping poorly. He does have additional problems to discuss today.  HPI   Patient would like to have some Rx refilled.   Labs ordered on 01/08/21 not drawn yet.    Allergies medication is helping.  Mild deviated septum to left. Seen Dr. Jenne PaneBates, he sleep study order.  Dr. Jenne PaneBates was ordering a sleep study per patient, he did not here from sleep medicine yet.   He is still vaping, knows he wants to quit - he did quit smoking cigarettes  16 months. He weaned and supplementing with vaping no medicine.   Patient  denies any fever, body aches,chills, rash, chest pain, shortness of breath, nausea, vomiting, or diarrhea.  Denies dizziness, lightheadedness, pre syncopal or syncopal episodes.   Lab work done.    He was using Roman for erectile dysfunction in past, and wants to consider a prescription. Using for the past 3 years. He has used cilias in the past and Viagra.  He feels that the Cialis worked well for him he was on daily dose and had no problems.   Patient  denies any fever, body aches,chills, rash, chest pain, shortness of breath, nausea, vomiting, or diarrhea.  Denies dizziness, lightheadedness, pre syncopal or syncopal episodes.    Past Medical History:  Diagnosis Date  . Allergy    History reviewed. No pertinent surgical history. Social History   Socioeconomic History  . Marital status: Married    Spouse name: Not on file  . Number of children: Not on file  . Years of education: Not on file  . Highest education  level: Not on file  Occupational History  . Not on file  Tobacco Use  . Smoking status: Never Smoker  . Smokeless tobacco: Current User  Substance and Sexual Activity  . Alcohol use: Yes    Alcohol/week: 5.0 standard drinks    Types: 4 Glasses of wine, 1 Cans of beer per week  . Drug use: Never  . Sexual activity: Not on file  Other Topics Concern  . Not on file  Social History Narrative  . Not on file   Social Determinants of Health   Financial Resource Strain: Not on file  Food Insecurity: Not on file  Transportation Needs: Not on file  Physical Activity: Not on file  Stress: Not on file  Social Connections: Not on file  Intimate Partner Violence: Not on file   Family Status  Relation Name Status  . Mother  (Not Specified)  . Father  (Not Specified)   Family History  Problem Relation Age of Onset  . Other Mother        patient reported mother had hip replacement  . Other Father        patient wrote that father had kidney issues   Allergies  Allergen Reactions  . Other     Patient reports seasonal allergies.    Patient Care Team: Pamelyn Bancroft, Eula FriedMichelle S, FNP as PCP - General (Family Medicine)   Medications: Outpatient Medications Prior to  Visit  Medication Sig  . azelastine (ASTELIN) 0.1 % nasal spray Place 2 sprays into both nostrils 2 (two) times daily. Use in each nostril as directed  . fluticasone (FLONASE) 50 MCG/ACT nasal spray Place 2 sprays into both nostrils daily.  Marland Kitchen levocetirizine (XYZAL) 5 MG tablet Take 1 tablet (5 mg total) by mouth every evening.   No facility-administered medications prior to visit.    Review of Systems  Constitutional: Negative.   HENT: Positive for congestion and sinus pressure.        Improving continuing medications, he saw Dr. Jenne Pane ENT for deviated septum, conservative management now and sleep study still advised.  Respiratory: Positive for apnea (with sleep, loud snorinf reported by his significant order. ). Negative  for cough, choking, chest tightness, shortness of breath, wheezing and stridor.   Cardiovascular: Negative.   Gastrointestinal: Negative.   Genitourinary: Negative.   Musculoskeletal: Negative.   Skin: Negative.   Allergic/Immunologic: Positive for environmental allergies.  Neurological: Negative.   Hematological: Negative.   Psychiatric/Behavioral: Negative.       Objective    BP 119/80 (BP Location: Right Arm, Patient Position: Sitting, Cuff Size: Large)   Pulse 88   Ht 5\' 7"  (1.702 m)   Wt 201 lb (91.2 kg)   SpO2 97%   BMI 31.48 kg/m  BP Readings from Last 3 Encounters:  02/11/21 119/80  01/08/21 126/86   Wt Readings from Last 3 Encounters:  02/11/21 201 lb (91.2 kg)  01/08/21 199 lb 3.2 oz (90.4 kg)      Physical Exam Vitals and nursing note reviewed.  Constitutional:      General: He is not in acute distress.    Appearance: Normal appearance. He is well-developed. He is not ill-appearing, toxic-appearing or diaphoretic.     Comments: Patient is alert and oriented and responsive to questions Engages in eye contact with provider. Speaks in full sentences without any pauses without any shortness of breath or distress.    HENT:     Head: Normocephalic and atraumatic.     Right Ear: Hearing, tympanic membrane, ear canal and external ear normal.     Left Ear: Hearing, tympanic membrane, ear canal and external ear normal.     Nose: Nose normal.     Mouth/Throat:     Pharynx: Uvula midline. No oropharyngeal exudate.  Eyes:     General: Lids are normal. No scleral icterus.       Right eye: No discharge.        Left eye: No discharge.     Conjunctiva/sclera: Conjunctivae normal.     Pupils: Pupils are equal, round, and reactive to light.  Neck:     Thyroid: No thyromegaly.     Vascular: Normal carotid pulses. No carotid bruit, hepatojugular reflux or JVD.     Trachea: Trachea and phonation normal. No tracheal tenderness or tracheal deviation.     Meningeal:  Brudzinski's sign absent.  Cardiovascular:     Rate and Rhythm: Normal rate and regular rhythm.     Pulses: Normal pulses.     Heart sounds: Normal heart sounds, S1 normal and S2 normal. Heart sounds not distant. No murmur heard. No friction rub. No gallop.   Pulmonary:     Effort: Pulmonary effort is normal. No accessory muscle usage or respiratory distress.     Breath sounds: Normal breath sounds. No stridor. No wheezing, rhonchi or rales.  Chest:     Chest wall: No tenderness.  Abdominal:  General: Bowel sounds are normal. There is no distension.     Palpations: Abdomen is soft. There is no mass.     Tenderness: There is no abdominal tenderness. There is no guarding or rebound.  Genitourinary:    Comments:   Musculoskeletal:        General: No tenderness or deformity. Normal range of motion.     Cervical back: Full passive range of motion without pain, normal range of motion and neck supple.     Comments: Patient moves on and off of exam table and in room without difficulty. Gait is normal in hall and in room. Patient is oriented to person place time and situation. Patient answers questions appropriately and engages in conversation.   Lymphadenopathy:     Head:     Right side of head: No submental, submandibular, tonsillar, preauricular, posterior auricular or occipital adenopathy.     Left side of head: No submental, submandibular, tonsillar, preauricular, posterior auricular or occipital adenopathy.     Cervical: No cervical adenopathy.  Skin:    General: Skin is warm and dry.     Capillary Refill: Capillary refill takes less than 2 seconds.     Coloration: Skin is not pale.     Findings: No erythema or rash.     Nails: There is no clubbing.  Neurological:     Mental Status: He is alert and oriented to person, place, and time.     GCS: GCS eye subscore is 4. GCS verbal subscore is 5. GCS motor subscore is 6.     Cranial Nerves: No cranial nerve deficit.     Sensory: No  sensory deficit.     Motor: No abnormal muscle tone.     Coordination: Coordination normal.     Deep Tendon Reflexes: Reflexes are normal and symmetric. Reflexes normal.  Psychiatric:        Speech: Speech normal.        Behavior: Behavior normal.        Thought Content: Thought content normal.        Judgment: Judgment normal.       Last depression screening scores PHQ 2/9 Scores 01/08/2021  PHQ - 2 Score 1  PHQ- 9 Score 6   Last fall risk screening Fall Risk  01/08/2021  Falls in the past year? 0  Number falls in past yr: 0  Injury with Fall? 0   Last Audit-C alcohol use screening No flowsheet data found. A score of 3 or more in women, and 4 or more in men indicates increased risk for alcohol abuse, EXCEPT if all of the points are from question 1   Results for orders placed or performed in visit on 02/11/21  POCT urinalysis dipstick  Result Value Ref Range   Color, UA yellow    Clarity, UA clear    Glucose, UA Negative Negative   Bilirubin, UA Negative    Ketones, UA Negative    Spec Grav, UA 1.025 1.010 - 1.025   Blood, UA Negative    pH, UA 6.0 5.0 - 8.0   Protein, UA Negative Negative   Urobilinogen, UA 0.2 0.2 or 1.0 E.U./dL   Nitrite, UA Negative    Leukocytes, UA Negative Negative   Appearance Normal    Odor Normal     Assessment & Plan    Routine Health Maintenance and Physical Exam  Exercise Activities and Dietary recommendations Goals   None      There is no immunization history  on file for this patient.  Health Maintenance  Topic Date Due  . COVID-19 Vaccine (1) Never done  . COLONOSCOPY (Pts 45-59yrs Insurance coverage will need to be confirmed)  01/08/2022 (Originally 05/05/2008)  . TETANUS/TDAP  01/08/2022 (Originally 05/05/1982)  . Hepatitis C Screening  01/08/2022 (Originally [birth year]28/1962)  . HIV Screening  01/08/2022 (Originally 05/05/1978)  . INFLUENZA VACCINE  05/25/2021  . HPV VACCINES  Aged Out    Discussed health benefits of  physical activity, and encouraged him to engage in regular exercise appropriate for his age and condition.   Seasonal allergies  Screening for blood or protein in urine - Plan: POCT urinalysis dipstick  OSA (obstructive sleep apnea) - Plan: Ambulatory referral to Sleep Studies  Nasal septal deviation  Body mass index (BMI) of 31.0-31.9 in adult  Screening for colon cancer - Plan: Cologuard  Meds ordered this encounter  Medications  . Tadalafil 2.5 MG TABS    Sig: Take 1 tablet (2.5 mg total) by mouth daily.    Dispense:  30 tablet    Refill:  3    side effects and when to stop medication discussed. Has taken in past no problems. Discussed etiologies for erectile dysfunction,he denies any prostate symptoms, also denies any cardiovascular concerns at this time,  urology always recommended for consult.   Improving continuing medications, he saw Dr. Jenne Pane ENT for deviated septum, conservative management now and sleep study still advised.  Return in 2 months (on 04/13/2021), or if symptoms worsen or fail to improve, for at any time for any worsening symptoms.    The entirety of the information documented in the History of Present Illness, Review of Systems and Physical Exam were personally obtained by me. Portions of this information were initially documented by the CMA and reviewed by me for thoroughness and accuracy.   Red Flags discussed. The patient was given clear instructions to go to ER or return to medical center if any red flags develop, symptoms do not improve, worsen or new problems develop. They verbalized understanding.    Jairo Ben, FNP  [facility] [phone] (phone) [phone] (fax)  [facility]

## 2021-02-11 NOTE — Progress Notes (Signed)
Negative urine

## 2021-02-12 ENCOUNTER — Other Ambulatory Visit (HOSPITAL_BASED_OUTPATIENT_CLINIC_OR_DEPARTMENT_OTHER): Payer: Self-pay

## 2021-02-12 DIAGNOSIS — G4733 Obstructive sleep apnea (adult) (pediatric): Secondary | ICD-10-CM

## 2021-02-12 LAB — CBC WITH DIFFERENTIAL/PLATELET
Basophils Absolute: 0.1 10*3/uL (ref 0.0–0.2)
Basos: 1 %
EOS (ABSOLUTE): 0.4 10*3/uL (ref 0.0–0.4)
Eos: 4 %
Hematocrit: 47.4 % (ref 37.5–51.0)
Hemoglobin: 16.8 g/dL (ref 13.0–17.7)
Immature Grans (Abs): 0 10*3/uL (ref 0.0–0.1)
Immature Granulocytes: 0 %
Lymphocytes Absolute: 3.7 10*3/uL — ABNORMAL HIGH (ref 0.7–3.1)
Lymphs: 39 %
MCH: 31.9 pg (ref 26.6–33.0)
MCHC: 35.4 g/dL (ref 31.5–35.7)
MCV: 90 fL (ref 79–97)
Monocytes Absolute: 0.9 10*3/uL (ref 0.1–0.9)
Monocytes: 9 %
Neutrophils Absolute: 4.6 10*3/uL (ref 1.4–7.0)
Neutrophils: 47 %
Platelets: 315 10*3/uL (ref 150–450)
RBC: 5.26 x10E6/uL (ref 4.14–5.80)
RDW: 12.7 % (ref 11.6–15.4)
WBC: 9.6 10*3/uL (ref 3.4–10.8)

## 2021-02-12 LAB — LIPID PANEL
Chol/HDL Ratio: 4.3 ratio (ref 0.0–5.0)
Cholesterol, Total: 165 mg/dL (ref 100–199)
HDL: 38 mg/dL — ABNORMAL LOW (ref >39)
LDL Chol Calc (NIH): 74 mg/dL (ref 0–99)
Triglycerides: 330 mg/dL — ABNORMAL HIGH (ref 0–149)
VLDL Cholesterol Cal: 53 mg/dL — ABNORMAL HIGH (ref 5–40)

## 2021-02-12 LAB — TSH: TSH: 2.1 u[IU]/mL (ref 0.450–4.500)

## 2021-02-12 LAB — COMPREHENSIVE METABOLIC PANEL
ALT: 123 IU/L — ABNORMAL HIGH (ref 0–44)
AST: 74 IU/L — ABNORMAL HIGH (ref 0–40)
Albumin/Globulin Ratio: 1.3 (ref 1.2–2.2)
Albumin: 4.2 g/dL (ref 3.8–4.9)
Alkaline Phosphatase: 93 IU/L (ref 44–121)
BUN/Creatinine Ratio: 14 (ref 9–20)
BUN: 14 mg/dL (ref 6–24)
Bilirubin Total: 0.3 mg/dL (ref 0.0–1.2)
CO2: 19 mmol/L — ABNORMAL LOW (ref 20–29)
Calcium: 9.6 mg/dL (ref 8.7–10.2)
Chloride: 101 mmol/L (ref 96–106)
Creatinine, Ser: 0.99 mg/dL (ref 0.76–1.27)
Globulin, Total: 3.3 g/dL (ref 1.5–4.5)
Glucose: 85 mg/dL (ref 65–99)
Potassium: 4.5 mmol/L (ref 3.5–5.2)
Sodium: 140 mmol/L (ref 134–144)
Total Protein: 7.5 g/dL (ref 6.0–8.5)
eGFR: 89 mL/min/{1.73_m2} (ref >59)

## 2021-02-12 LAB — PSA: Prostate Specific Ag, Serum: 0.4 ng/mL (ref 0.0–4.0)

## 2021-02-12 LAB — VITAMIN D 25 HYDROXY (VIT D DEFICIENCY, FRACTURES): Vit D, 25-Hydroxy: 11.1 ng/mL — ABNORMAL LOW (ref 30.0–100.0)

## 2021-02-12 LAB — VITAMIN B12: Vitamin B-12: 575 pg/mL (ref 232–1245)

## 2021-02-18 ENCOUNTER — Other Ambulatory Visit: Payer: Self-pay | Admitting: Adult Health

## 2021-02-18 DIAGNOSIS — R748 Abnormal levels of other serum enzymes: Secondary | ICD-10-CM

## 2021-02-18 DIAGNOSIS — E559 Vitamin D deficiency, unspecified: Secondary | ICD-10-CM

## 2021-02-18 MED ORDER — VITAMIN D (ERGOCALCIFEROL) 1.25 MG (50000 UNIT) PO CAPS: 50000.0000 [IU] | ORAL_CAPSULE | ORAL | 0 refills | Status: DC

## 2021-02-18 NOTE — Progress Notes (Signed)
CBC within normal limits.  CMP shows elevated ALT and AST liver enzymes elevated avoid any alcohol or excessive tylenol.  TSH for thyroid within normal limits.  PSA for prostate within normal limits.  Vitamin  D is low, this can contribute to poor sleep and fatigue, will send in prescription for Vitamin D at 50,000 units by mouth once every 7 days/(once weekly) for 12 weeks. Advise recheck lab Vitamin D in 1-2 weeks after completing vitamin d prescription. Lab iis walk in and is closed during lunch during regular office hours.  Triglycerides elevated.  Discuss lifestyle modification with patient e.g. increase exercise, fiber, fruits, vegetables, lean meat, and omega 3/fish intake and decrease saturated fat.  If patient following strict diet and exercise program already please schedule follow up appointment with primary care physician. Recheck around 6 months lipid panel.    Please schedule for repeat CMP, acute hepatitis panel and GGT in one month.

## 2021-03-04 ENCOUNTER — Other Ambulatory Visit: Payer: Self-pay

## 2021-03-04 ENCOUNTER — Ambulatory Visit (HOSPITAL_BASED_OUTPATIENT_CLINIC_OR_DEPARTMENT_OTHER): Payer: Commercial Managed Care - PPO | Attending: Otolaryngology | Admitting: Internal Medicine

## 2021-03-04 VITALS — Ht 67.0 in | Wt 195.0 lb

## 2021-03-04 DIAGNOSIS — G4733 Obstructive sleep apnea (adult) (pediatric): Secondary | ICD-10-CM | POA: Insufficient documentation

## 2021-03-04 DIAGNOSIS — R0902 Hypoxemia: Secondary | ICD-10-CM | POA: Diagnosis not present

## 2021-03-04 DIAGNOSIS — G4736 Sleep related hypoventilation in conditions classified elsewhere: Secondary | ICD-10-CM | POA: Diagnosis not present

## 2021-03-14 NOTE — Procedures (Signed)
    Patient Name: Gordon Moore, Gordon Moore Date: 03/04/2021 Gender: Male D.O.B: 09-13-1961 Age (years): 59 Referring Provider: Christia Reading Height (inches): 67 Interpreting Physician: Jetty Duhamel MD, ABSM Weight (lbs): 200 RPSGT: [place] BMI: 31 MRN: [id number] Neck Size:   CLINICAL INFORMATION Sleep Study Type: HST Indication for sleep study: OSA Epworth Sleepiness Score:  3  SLEEP STUDY TECHNIQUE A multi-channel overnight portable sleep study was performed. The channels recorded were: nasal airflow, thoracic respiratory movement, and oxygen saturation with a pulse oximetry. Snoring was also monitored.  MEDICATIONS Patient self administered medications include: none reported.  SLEEP ARCHITECTURE Patient was studied for 434.7 minutes. The sleep efficiency was 99.5 % and the patient was supine for 0%. The arousal index was 0.0 per hour.  RESPIRATORY PARAMETERS The overall AHI was 76.1 per hour, with a central apnea index of 0 per hour. The oxygen nadir was 78% during sleep.  CARDIAC DATA Mean heart rate during sleep was 73.9 bpm.  IMPRESSIONS - Severe obstructive sleep apnea occurred during this study (AHI = 76.1/h). - Oxygen desaturation was noted during this study (Min O2 = 78%). Mean O2 saturation 93%. - Time with O2 saturation 89% or less was 78 minutes. - Patient snored.  DIAGNOSIS - Obstructive Sleep Apnea (G47.33) - Nocturnal Hypoxemia (G47.36)  RECOMMENDATIONS - Suggest CPAP titration sleep study or autopap. Other options would be based on clinical judgment. - Be careful with alcohol, sedatives and other CNS depressants that may worsen sleep apnea and disrupt normal sleep architecture. - Sleep hygiene should be reviewed to assess factors that may improve sleep quality. - Weight management and regular exercise should be initiated or continued.  [Electronically signed] 03/14/2021 10:01 AM  Jetty Duhamel MD, ABSM Diplomate, American Board of Sleep  Medicine   NPI: [id number]                        Jetty Duhamel Diplomate, American Board of Sleep Medicine  ELECTRONICALLY SIGNED ON:  03/14/2021, 9:54 AM [facility] PH: [phone]   FX: [phone] ACCREDITED BY THE AMERICAN ACADEMY OF SLEEP MEDICINE

## 2021-03-20 ENCOUNTER — Other Ambulatory Visit (INDEPENDENT_AMBULATORY_CARE_PROVIDER_SITE_OTHER): Payer: Commercial Managed Care - PPO

## 2021-03-20 ENCOUNTER — Telehealth: Payer: Self-pay | Admitting: Adult Health

## 2021-03-20 ENCOUNTER — Other Ambulatory Visit: Payer: Self-pay

## 2021-03-20 DIAGNOSIS — E559 Vitamin D deficiency, unspecified: Secondary | ICD-10-CM

## 2021-03-20 DIAGNOSIS — R748 Abnormal levels of other serum enzymes: Secondary | ICD-10-CM

## 2021-03-20 NOTE — Telephone Encounter (Signed)
Patient wanted to know when he needs to retest for his vit D

## 2021-03-20 NOTE — Telephone Encounter (Signed)
Left message to call back  

## 2021-03-20 NOTE — Telephone Encounter (Signed)
Pt called back returning your call °

## 2021-03-21 LAB — SPECIMEN STATUS REPORT

## 2021-03-21 LAB — COMPREHENSIVE METABOLIC PANEL
AST: 72 IU/L — ABNORMAL HIGH (ref 0–40)
BUN/Creatinine Ratio: 14 (ref 9–20)
Bilirubin Total: 0.5 mg/dL (ref 0.0–1.2)
Calcium: 9.3 mg/dL (ref 8.7–10.2)
eGFR: 101 mL/min/{1.73_m2} (ref >59)

## 2021-03-23 NOTE — Progress Notes (Signed)
GGT within normal limits.  CMP with elevated  glucose.  AST and ALT elevated but slightly improved recommend discontinuing alcohol, and tylenol. Acute hepatitis panel has not resulted. If not drawn, please add lab - and also add recheck CMP recheck for future one month.  Vitamin D is improved, and recommend he start Vitamin D3 at  2,000 international units daily to maintain.

## 2021-03-24 ENCOUNTER — Telehealth: Payer: Self-pay

## 2021-03-24 DIAGNOSIS — R748 Abnormal levels of other serum enzymes: Secondary | ICD-10-CM

## 2021-03-24 NOTE — Telephone Encounter (Signed)
Ordered CMP for future labs.

## 2021-03-24 NOTE — Telephone Encounter (Signed)
-----   Message from Berniece Pap, FNP sent at 03/23/2021  2:03 PM EDT ----- GGT within normal limits.  CMP with elevated  glucose.  AST and ALT elevated but slightly improved recommend discontinuing alcohol, and tylenol. Acute hepatitis panel has not resulted. If not drawn, please add lab - and also add recheck CMP recheck for future one month.  Vitamin D is improved, and recommend he start Vitamin D3 at  2,000 international units daily to maintain.

## 2021-03-25 LAB — COMPREHENSIVE METABOLIC PANEL
ALT: 105 IU/L — ABNORMAL HIGH (ref 0–44)
Albumin/Globulin Ratio: 1.5 (ref 1.2–2.2)
Albumin: 4.4 g/dL (ref 3.8–4.9)
Alkaline Phosphatase: 83 IU/L (ref 44–121)
BUN: 12 mg/dL (ref 6–24)
CO2: 23 mmol/L (ref 20–29)
Chloride: 103 mmol/L (ref 96–106)
Creatinine, Ser: 0.86 mg/dL (ref 0.76–1.27)
Globulin, Total: 2.9 g/dL (ref 1.5–4.5)
Glucose: 131 mg/dL — ABNORMAL HIGH (ref 65–99)
Potassium: 4 mmol/L (ref 3.5–5.2)
Sodium: 141 mmol/L (ref 134–144)
Total Protein: 7.3 g/dL (ref 6.0–8.5)

## 2021-03-25 LAB — GAMMA GT: GGT: 39 IU/L (ref 0–65)

## 2021-03-25 LAB — VITAMIN D 25 HYDROXY (VIT D DEFICIENCY, FRACTURES): Vit D, 25-Hydroxy: 59.2 ng/mL (ref 30.0–100.0)

## 2021-04-02 LAB — HCV RT-PCR, QUANT (NON-GRAPH)

## 2021-04-02 LAB — ACUTE VIRAL HEPATITIS (HAV, HBV, HCV)
HCV Ab: >11 s/co ratio — ABNORMAL HIGH (ref 0.0–0.9)
Hep A IgM: NEGATIVE
Hep B C IgM: NEGATIVE
Hepatitis B Surface Ag: NEGATIVE

## 2021-04-02 LAB — SPECIMEN STATUS REPORT

## 2021-04-03 ENCOUNTER — Telehealth: Payer: Self-pay

## 2021-04-03 DIAGNOSIS — R748 Abnormal levels of other serum enzymes: Secondary | ICD-10-CM

## 2021-04-03 NOTE — Telephone Encounter (Signed)
Orders placed for Acute viral hepatitis for future labs.

## 2021-04-09 ENCOUNTER — Other Ambulatory Visit: Payer: Self-pay

## 2021-04-09 ENCOUNTER — Other Ambulatory Visit (INDEPENDENT_AMBULATORY_CARE_PROVIDER_SITE_OTHER): Payer: Commercial Managed Care - PPO

## 2021-04-09 DIAGNOSIS — R748 Abnormal levels of other serum enzymes: Secondary | ICD-10-CM

## 2021-04-09 DIAGNOSIS — E559 Vitamin D deficiency, unspecified: Secondary | ICD-10-CM | POA: Diagnosis not present

## 2021-04-09 LAB — COMPREHENSIVE METABOLIC PANEL
ALT: 108 U/L — ABNORMAL HIGH (ref 0–53)
AST: 66 U/L — ABNORMAL HIGH (ref 0–37)
Albumin: 4.2 g/dL (ref 3.5–5.2)
Alkaline Phosphatase: 70 U/L (ref 39–117)
BUN: 12 mg/dL (ref 6–23)
CO2: 25 mEq/L (ref 19–32)
Calcium: 9 mg/dL (ref 8.4–10.5)
Chloride: 102 mEq/L (ref 96–112)
Creatinine, Ser: 0.8 mg/dL (ref 0.40–1.50)
GFR: 97.95 mL/min (ref >60.00)
Glucose, Bld: 98 mg/dL (ref 70–99)
Potassium: 3.9 mEq/L (ref 3.5–5.1)
Sodium: 135 mEq/L (ref 135–145)
Total Bilirubin: 0.7 mg/dL (ref 0.2–1.2)
Total Protein: 7.4 g/dL (ref 6.0–8.3)

## 2021-04-09 LAB — VITAMIN D 25 HYDROXY (VIT D DEFICIENCY, FRACTURES): VITD: 59.17 ng/mL (ref 30.00–100.00)

## 2021-04-14 LAB — ACUTE VIRAL HEPATITIS (HAV, HBV, HCV)
HCV Ab: >11 s/co ratio — ABNORMAL HIGH (ref 0.0–0.9)
Hep A IgM: NEGATIVE
Hep B C IgM: NEGATIVE
Hepatitis B Surface Ag: NEGATIVE

## 2021-04-14 LAB — HCV RT-PCR, QUANT (NON-GRAPH)
HCV log10: 6.365 log10 IU/mL
Hepatitis C Quantitation: 2320000 IU/mL

## 2021-04-16 ENCOUNTER — Encounter: Payer: Self-pay | Admitting: *Deleted

## 2021-04-16 ENCOUNTER — Other Ambulatory Visit: Payer: Self-pay | Admitting: Adult Health

## 2021-04-16 DIAGNOSIS — R748 Abnormal levels of other serum enzymes: Secondary | ICD-10-CM

## 2021-04-16 DIAGNOSIS — B182 Chronic viral hepatitis C: Secondary | ICD-10-CM

## 2021-04-16 NOTE — Progress Notes (Signed)
Ley patient know that he has a positive hepatitis C   and a high acute viral load is elevated. Had he ever had hep C in the past and been treated '? GI will do further testing and work up// treatment.  Please send him Hepatitis information if you can get him to sign up for my chart or direct him to the [facility] website.  ALT and AST elevated liver enzyme likely due to the above. Procedures  Ambulatory referral to Gastroenterology   Referral Priority:   Urgent   Referral Type:   Consultation   Referral Reason:   Specialty Services Required   Referred to Provider:   Toney Reil, MD   Number of Visits Requested:   1  Needs hep C further work up and treatment options as well as colonoscopy and he should keep appointment and have heard within 1 week for appointment to be schedueld and if not call us back.

## 2021-04-16 NOTE — Progress Notes (Signed)
Orders Placed This Encounter  Procedures   Ambulatory referral to Gastroenterology    Referral Priority:   Urgent    Referral Type:   Consultation    Referral Reason:   Specialty Services Required    Referred to Provider:   Toney Reil, MD    Number of Visits Requested:   1    Needs hep C further work up and treatment options as well as colonoscopy and he should keep appointment and have heard within 1 week for appointment to be schedueld and if not call us back.

## 2021-04-20 ENCOUNTER — Telehealth: Payer: Self-pay

## 2021-04-20 NOTE — Telephone Encounter (Signed)
noted 

## 2021-04-20 NOTE — Telephone Encounter (Signed)
LMTCB in regards to lab results.  

## 2021-04-20 NOTE — Telephone Encounter (Signed)
Patient called back stating he received his lab results.

## 2021-05-11 ENCOUNTER — Other Ambulatory Visit: Payer: Self-pay | Admitting: Adult Health

## 2021-05-15 ENCOUNTER — Telehealth: Payer: Self-pay

## 2021-05-15 NOTE — Telephone Encounter (Signed)
Pt is calling about CPAP machine. He states that we sent him for a sleep study with Dr Jenne Pane. He has been called twice in the past couple of months about the machine and insurance processing but not sure who called him. He states that it was not our office that contacted him. I asked him if he called Dr Jenne Pane office and he did not. He would like a call back to clarify how he can get his cpap machine?

## 2021-05-20 NOTE — Telephone Encounter (Signed)
Placed call to pt on 7/22 and pt stated he did not know who contacted him about his machine so he called our office to see what we could do. I told the pt I'd look into it and call him back.I then called Dr.Bates office to see about the order, I received no answer and left a voicemail. I have not received a call back. I placed a call to pt today 7/27 to check in. Pt states she spoke to several different people in the last few days and still has not received a straight forward answer on what's going on. Pt has the numbers for [employer] and the office he received the care at.

## 2021-06-01 ENCOUNTER — Encounter: Payer: Self-pay | Admitting: Gastroenterology

## 2021-06-01 ENCOUNTER — Other Ambulatory Visit: Payer: Self-pay

## 2021-06-01 ENCOUNTER — Ambulatory Visit (INDEPENDENT_AMBULATORY_CARE_PROVIDER_SITE_OTHER): Payer: Commercial Managed Care - PPO | Admitting: Gastroenterology

## 2021-06-01 VITALS — BP 132/85 | HR 76 | Temp 98.4°F | Ht 67.0 in | Wt 203.2 lb

## 2021-06-01 DIAGNOSIS — B182 Chronic viral hepatitis C: Secondary | ICD-10-CM | POA: Diagnosis not present

## 2021-06-01 DIAGNOSIS — R748 Abnormal levels of other serum enzymes: Secondary | ICD-10-CM

## 2021-06-01 NOTE — Progress Notes (Signed)
Gastroenterology Consultation  Referring Provider:     Stephanie Moore* Primary Care Physician:  Gordon Pap, FNP Primary Gastroenterologist:  Dr. Servando Moore     Reason for Consultation:     Hepatitis C        HPI:   Gordon Moore is a 60 y.o. y/o male referred for consultation & management of hepatitis C by Gordon Moore, Gordon Fried, FNP.  This patient comes in today with a history of abnormal liver enzymes and had his blood work sent off for hepatitis C.  The patient hepatitis C antibody was positive.  The patient's blood work for hepatitis A or B was negative.  The patient was also recommended to have a colonoscopy. The patient reports high risk activity many years ago when he was in his 30s.  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  There is no report of any homemade tattoos or high risk sexual activity.  The patient is engaged to be married and is in a monogamous relationship.  There is no report of any unexplained weight loss.  The patient does report that he used to drink very heavily when he was in his 72s but now only drinks socially.   Past Medical History:  Diagnosis Date   Allergy     No past surgical history on file.  Prior to Admission medications   Medication Sig Start Date End Date Taking? Authorizing Provider  azelastine (ASTELIN) 0.1 % nasal spray Place 2 sprays into both nostrils 2 (two) times daily. Use in each nostril as directed 01/08/21   Flinchum, Gordon Fried, FNP  fluticasone (FLONASE) 50 MCG/ACT nasal spray Place 2 sprays into both nostrils daily. 01/08/21   Flinchum, Gordon Fried, FNP  levocetirizine (XYZAL) 5 MG tablet Take 1 tablet (5 mg total) by mouth every evening. 01/08/21   Flinchum, Gordon Fried, FNP  Tadalafil 2.5 MG TABS Take 1 tablet (2.5 mg total) by mouth daily. 02/11/21   Flinchum, Gordon Fried, FNP  Vitamin D, Ergocalciferol, (DRISDOL) 1.25 MG (50000 UNIT) CAPS capsule Take 1 capsule (50,000 Units total) by mouth every 7 (seven)  days. (taking one tablet per week) walk in lab in office 1-2 weeks after completing prescription. 02/18/21   Flinchum, Gordon Fried, FNP    Family History  Problem Relation Age of Onset   Other Mother        patient reported mother had hip replacement   Other Father        patient wrote that father had kidney issues     Social History   Tobacco Use   Smoking status: Never   Smokeless tobacco: Current  Substance Use Topics   Alcohol use: Yes    Alcohol/week: 5.0 standard drinks    Types: 4 Glasses of wine, 1 Cans of beer per week   Drug use: Never    Allergies as of 06/01/2021 - Review Complete 02/11/2021  Allergen Reaction Noted   Other  01/08/2021    Review of Systems:    All systems reviewed and negative except where noted in HPI.   Physical Exam:  There were no vitals taken for this visit. No LMP for male patient. General:   Alert,  Well-developed, well-nourished, pleasant and cooperative in NAD Head:  Normocephalic and atraumatic. Eyes:  Sclera clear, no icterus.   Conjunctiva pink. Ears:  Normal auditory acuity. Neck:  Supple; no masses or thyromegaly. Lungs:  Respirations even and unlabored.  Clear throughout to auscultation.  No wheezes, crackles, or rhonchi. No acute distress. Heart:  Regular rate and rhythm; no murmurs, clicks, rubs, or gallops. Abdomen:  Normal bowel sounds.  No bruits.  Soft, non-tender and non-distended without masses, hepatosplenomegaly or hernias noted.  No guarding or rebound tenderness.  Negative Carnett sign.   Rectal:  Deferred.  Pulses:  Normal pulses noted. Extremities:  No clubbing or edema.  No cyanosis. Neurologic:  Alert and oriented x3;  grossly normal neurologically. Skin:  Intact without significant lesions or rashes.  No jaundice. Lymph Nodes:  No significant cervical adenopathy. Psych:  Alert and cooperative. Normal mood and affect.  Imaging Studies: No results found.  Assessment and Plan:   Gordon Moore is a 60 y.o.  y/o male who comes in today with a history of having a hepatitis C antibody positive and abnormal liver enzymes.  The patient also had a positive viral load.  The patient will have lab sent off prior to treating him for his hepatitis C.  The patient will also have his level of fibrosis investigated prior to treating him for his hepatitis C.  The patient has also been told to get his fiance tested to make sure she does not have hepatitis C.  The patient will be notified of his labs and will be started on treatment for his hepatitis C accordingly.  The patient has been explained the plan and agrees with it.    Midge Minium, MD. [name]    Note: This dictation was prepared with Dragon dictation along with smaller phrase technology. Any transcriptional errors that result from this process are unintentional.

## 2021-06-06 LAB — HCV FIBROSURE
ALPHA 2-MACROGLOBULINS, QN: 493 mg/dL — ABNORMAL HIGH (ref 110–276)
ALT (SGPT) P5P: 127 IU/L — ABNORMAL HIGH (ref 0–55)
Apolipoprotein A-1: 139 mg/dL (ref 101–178)
Bilirubin, Total: 0.1 mg/dL (ref 0.0–1.2)
Fibrosis Score: 0.41 — ABNORMAL HIGH (ref 0.00–0.21)
GGT: 36 IU/L (ref 0–65)
Haptoglobin: 141 mg/dL (ref 29–370)
Necroinflammat Activity Score: 0.7 — ABNORMAL HIGH (ref 0.00–0.17)

## 2021-06-06 LAB — HEPATIC FUNCTION PANEL
ALT: 115 IU/L — ABNORMAL HIGH (ref 0–44)
AST: 66 IU/L — ABNORMAL HIGH (ref 0–40)
Albumin: 4.5 g/dL (ref 3.8–4.9)
Alkaline Phosphatase: 76 IU/L (ref 44–121)
Bilirubin Total: 0.4 mg/dL (ref 0.0–1.2)
Bilirubin, Direct: 0.13 mg/dL (ref 0.00–0.40)
Total Protein: 7.3 g/dL (ref 6.0–8.5)

## 2021-06-06 LAB — MITOCHONDRIAL ANTIBODIES: Mitochondrial Ab: <20 Units (ref 0.0–20.0)

## 2021-06-06 LAB — ANA: Anti Nuclear Antibody (ANA): NEGATIVE

## 2021-06-06 LAB — HEPATITIS A ANTIBODY, TOTAL: hep A Total Ab: NEGATIVE

## 2021-06-06 LAB — IRON AND TIBC
Iron Saturation: 34 % (ref 15–55)
Iron: 137 ug/dL (ref 38–169)
Total Iron Binding Capacity: 398 ug/dL (ref 250–450)
UIBC: 261 ug/dL (ref 111–343)

## 2021-06-06 LAB — HEPATITIS B SURFACE ANTIBODY,QUALITATIVE: Hep B Surface Ab, Qual: NONREACTIVE

## 2021-06-06 LAB — CERULOPLASMIN: Ceruloplasmin: 23.8 mg/dL (ref 16.0–31.0)

## 2021-06-06 LAB — ANTI-SMOOTH MUSCLE ANTIBODY, IGG: Smooth Muscle Ab: 44 Units — ABNORMAL HIGH (ref 0–19)

## 2021-06-06 LAB — HEPATITIS C GENOTYPE

## 2021-06-06 LAB — ALPHA-1-ANTITRYPSIN: A-1 Antitrypsin: 150 mg/dL (ref 101–187)

## 2021-06-06 LAB — FERRITIN: Ferritin: 225 ng/mL (ref 30–400)

## 2021-06-10 ENCOUNTER — Telehealth: Payer: Self-pay

## 2021-06-10 NOTE — Telephone Encounter (Signed)
-----   Message from Midge Minium, MD sent at 06/05/2021  5:05 PM EDT ----- Please let the patient know that he is not immune to hepatitis A or hepatitis B and should be vaccinated for those.  He also needs to be started on his treatment for hepatitis C.

## 2021-06-10 NOTE — Telephone Encounter (Signed)
LVM for pt to return my call.

## 2021-06-11 ENCOUNTER — Other Ambulatory Visit: Payer: Self-pay | Admitting: Adult Health

## 2021-06-19 ENCOUNTER — Other Ambulatory Visit: Payer: Self-pay | Admitting: Adult Health

## 2021-06-19 NOTE — Telephone Encounter (Signed)
Contact pt today and advised him of his recent lab results. Paperwork completed and faxed to [facility].

## 2021-06-25 ENCOUNTER — Other Ambulatory Visit: Payer: Self-pay | Admitting: Adult Health

## 2021-06-25 DIAGNOSIS — J302 Other seasonal allergic rhinitis: Secondary | ICD-10-CM

## 2021-06-27 ENCOUNTER — Other Ambulatory Visit: Payer: Self-pay | Admitting: Adult Health

## 2021-07-07 ENCOUNTER — Telehealth: Payer: Self-pay | Admitting: Adult Health

## 2021-07-07 DIAGNOSIS — J302 Other seasonal allergic rhinitis: Secondary | ICD-10-CM

## 2021-07-07 MED ORDER — TADALAFIL 2.5 MG PO TABS: 1.0000 | ORAL_TABLET | Freq: Every day | ORAL | 3 refills | Status: DC

## 2021-07-07 MED ORDER — LEVOCETIRIZINE DIHYDROCHLORIDE 5 MG PO TABS: 5.0000 mg | ORAL_TABLET | Freq: Every evening | ORAL | 1 refills | Status: DC

## 2021-07-07 NOTE — Addendum Note (Signed)
Addended byElise Benne T on: 07/07/2021 04:14 PM   Modules accepted: Orders

## 2021-07-07 NOTE — Telephone Encounter (Signed)
Patient came into office and needs the following refills; levocetirizine (XYZAL) 5 MG tablet, and Tadalafil 2.5 MG TABS

## 2021-07-17 ENCOUNTER — Encounter: Payer: Self-pay | Admitting: Adult Health

## 2021-07-24 ENCOUNTER — Other Ambulatory Visit: Payer: Self-pay | Admitting: Adult Health

## 2021-07-24 DIAGNOSIS — J302 Other seasonal allergic rhinitis: Secondary | ICD-10-CM

## 2021-08-16 ENCOUNTER — Other Ambulatory Visit: Payer: Self-pay | Admitting: Adult Health

## 2021-08-16 DIAGNOSIS — J302 Other seasonal allergic rhinitis: Secondary | ICD-10-CM

## 2021-08-24 ENCOUNTER — Ambulatory Visit: Payer: Commercial Managed Care - PPO | Admitting: Adult Health

## 2021-08-27 ENCOUNTER — Other Ambulatory Visit: Payer: Self-pay | Admitting: Adult Health

## 2021-08-27 DIAGNOSIS — J302 Other seasonal allergic rhinitis: Secondary | ICD-10-CM

## 2021-11-20 ENCOUNTER — Other Ambulatory Visit: Payer: Self-pay | Admitting: Adult Health

## 2021-12-11 ENCOUNTER — Telehealth: Payer: Self-pay | Admitting: Adult Health

## 2021-12-11 ENCOUNTER — Telehealth: Payer: Self-pay

## 2021-12-11 DIAGNOSIS — J302 Other seasonal allergic rhinitis: Secondary | ICD-10-CM

## 2021-12-11 MED ORDER — AZELASTINE HCL 0.1 % NA SOLN: 2.0000 | Freq: Two times a day (BID) | NASAL | 12 refills | Status: DC

## 2021-12-11 MED ORDER — LEVOCETIRIZINE DIHYDROCHLORIDE 5 MG PO TABS: 5.0000 mg | ORAL_TABLET | Freq: Every evening | ORAL | 1 refills | Status: DC

## 2021-12-11 MED ORDER — TADALAFIL 2.5 MG PO TABS: 1.0000 | ORAL_TABLET | Freq: Every day | ORAL | 3 refills | Status: DC

## 2021-12-11 NOTE — Addendum Note (Signed)
Addended by: Essie Hart C on: 12/11/2021 03:53 PM   Modules accepted: Orders

## 2021-12-11 NOTE — Telephone Encounter (Signed)
Medications [facility] [address] in [place].  Will be calling pt to schedule a f/u appt since he is overdue to see provider.

## 2021-12-11 NOTE — Telephone Encounter (Signed)
Patient came into the office requesting the following refills; azelastine (ASTELIN) 0.1 % nasal spray, levocetirizine (XYZAL) 5 MG tablet, and Tadalafil 2.5 MG TABS. [facility]

## 2021-12-11 NOTE — Telephone Encounter (Signed)
Refills have been sent in to pharmacy. LVM for pt to return call to schedule f/u appt.

## 2022-03-08 ENCOUNTER — Encounter: Payer: Self-pay | Admitting: Nurse Practitioner

## 2022-03-08 ENCOUNTER — Ambulatory Visit (INDEPENDENT_AMBULATORY_CARE_PROVIDER_SITE_OTHER)
Admission: RE | Admit: 2022-03-08 | Discharge: 2022-03-08 | Disposition: A | Discharge status: Home / Self Care | Payer: Commercial Managed Care - PPO | Source: Ambulatory Visit | Attending: Nurse Practitioner | Admitting: Nurse Practitioner

## 2022-03-08 ENCOUNTER — Ambulatory Visit: Payer: Commercial Managed Care - PPO | Admitting: Nurse Practitioner

## 2022-03-08 VITALS — BP 134/82 | HR 84 | Temp 97.5°F | Resp 14 | Ht 67.0 in | Wt 196.4 lb

## 2022-03-08 DIAGNOSIS — E6609 Other obesity due to excess calories: Secondary | ICD-10-CM | POA: Diagnosis not present

## 2022-03-08 DIAGNOSIS — K732 Chronic active hepatitis, not elsewhere classified: Secondary | ICD-10-CM | POA: Insufficient documentation

## 2022-03-08 DIAGNOSIS — Z7689 Persons encountering health services in other specified circumstances: Secondary | ICD-10-CM

## 2022-03-08 DIAGNOSIS — J302 Other seasonal allergic rhinitis: Secondary | ICD-10-CM

## 2022-03-08 DIAGNOSIS — G4733 Obstructive sleep apnea (adult) (pediatric): Secondary | ICD-10-CM | POA: Diagnosis not present

## 2022-03-08 DIAGNOSIS — Z6830 Body mass index (BMI) 30.0-30.9, adult: Secondary | ICD-10-CM

## 2022-03-08 DIAGNOSIS — Z125 Encounter for screening for malignant neoplasm of prostate: Secondary | ICD-10-CM | POA: Diagnosis not present

## 2022-03-08 DIAGNOSIS — M79671 Pain in right foot: Secondary | ICD-10-CM | POA: Diagnosis not present

## 2022-03-08 DIAGNOSIS — J343 Hypertrophy of nasal turbinates: Secondary | ICD-10-CM

## 2022-03-08 DIAGNOSIS — Z122 Encounter for screening for malignant neoplasm of respiratory organs: Secondary | ICD-10-CM

## 2022-03-08 DIAGNOSIS — N529 Male erectile dysfunction, unspecified: Secondary | ICD-10-CM | POA: Insufficient documentation

## 2022-03-08 MED ORDER — TADALAFIL 2.5 MG PO TABS: 1.0000 | ORAL_TABLET | Freq: Every day | ORAL | 3 refills | Status: AC

## 2022-03-08 MED ORDER — AZELASTINE HCL 0.1 % NA SOLN: 2.0000 | Freq: Two times a day (BID) | NASAL | 12 refills | Status: AC

## 2022-03-08 NOTE — Assessment & Plan Note (Signed)
Continue using CPAP machine as directed.

## 2022-03-08 NOTE — Assessment & Plan Note (Signed)
Patient has been evaluated in the past by GI.  He is started on hep C medications but had to stop due to financial restrictions.  He has not followed back up with GI.  We did discuss the importance of this.  Patient wants to call his insurance company to see which hep C treatments would be covered at a higher rate.  And then he will circle back with me in regards to getting back in with GI.  Also tried to refer for colonoscopy patient wants to defer at the current time states is not quite ready to discuss other options such as Cologuard and iFOB.  Cologuard order was placed last year but never completed.  Anticipate elevated liver functions on blood work today ?

## 2022-03-08 NOTE — Assessment & Plan Note (Signed)
Unable to obtain sometimes difficult to maintain erection will use tadalafil 2.5 mg as needed seems to help some.  Continues medication as needed and as prescribed. ?

## 2022-03-08 NOTE — Assessment & Plan Note (Signed)
Has been maintained on levocetirizine, Astelin nasal spray, fluticasone nasal spray.  Continue as prescribed ?

## 2022-03-08 NOTE — Progress Notes (Signed)
? ?Established Patient Office Visit ? ?Subjective   ?Patient ID: Gordon Moore, male    DOB: [birth year]  Age: 61 y.o. MRN: [id number] ? ?Chief Complaint  ?Patient presents with  ? Transfer of Care  ?  From [facility] [place] location  ? Foot Pain  ?  In the heel area on the right side and sometimes pain with left foot. Sx present few months. Some tingling/numbness sensation present.   ? ? ? ? ?OSA: CPAP machine. States overall regular use minus the past couple of weeks. Dr. Jenne [facility]aneBates ENT in the beginning ? ?Allergies:  tolerates medication well. Currenlty on the antihistamine. ? ?ED: previously used as needed. Would not get a full erection  or maintian an erection.  States he does not use the medication as of late as he is going through a separation with her fianc?. ? ?Foot pain: states that it is right sided. States that it is worse after rest and sometimes when he is on  his feet. States the pain can be a combo of a real stabing pain, tingling sometimes. Been going on for "a while" for at least 6 months. Has gotten new shoes that helped for a white. Has used an ace wrap. Ibuprofen in the past. Helped some in the past ? ?for complete physical and follow up of chronic conditions. ? ?Immunizations: ?-Tetanus: unsure. ?-Influenza: flu vaccine ?-Covid-19: pfizer x2 and one booster ?-Shingles: Information given ?-Pneumonia: Too young ? ?-HPV: Aged out ? ? ? ? ?Colonoscopy:  does not want to do it yet.  Did discuss colonoscopy versus Cologuard versus iFOB. ?Dexa: Too young ?PSA: Drawn today ? ?Lung Cancer Screening: Ambulatory order placed today ? ? ? ? ?Review of Systems  ?Constitutional:  Negative for chills and fever.  ?Respiratory:  Negative for cough and shortness of breath.   ?Cardiovascular:  Negative for chest pain.  ?Gastrointestinal:  Negative for diarrhea, nausea and vomiting.  ?Genitourinary:  Negative for dysuria and hematuria.  ?Musculoskeletal:  Positive for joint pain.  ?Neurological:  Negative for  weakness.  ? ?  ?Objective:  ?  ? ?BP 134/82   Pulse 84   Temp (!) 97.5 ?F (36.4 ?C)   Resp 14   Ht 5\' 7"  (1.702 m)   Wt 196 lb 6 oz (89.1 kg)   SpO2 97%   BMI 30.76 kg/m?  ? ? ?Physical Exam ?Vitals and nursing note reviewed.  ?Constitutional:   ?   Appearance: Normal appearance. He is obese.  ?HENT:  ?   Right Ear: Tympanic membrane, ear canal and external ear normal.  ?   Left Ear: Tympanic membrane, ear canal and external ear normal.  ?   Mouth/Throat:  ?   Mouth: Mucous membranes are moist.  ?   Pharynx: Oropharynx is clear.  ?Eyes:  ?   Extraocular Movements: Extraocular movements intact.  ?   Pupils: Pupils are equal, round, and reactive to light.  ?Cardiovascular:  ?   Rate and Rhythm: Normal rate and regular rhythm.  ?   Pulses: Normal pulses.  ?   Heart sounds: Normal heart sounds.  ?Pulmonary:  ?   Effort: Pulmonary effort is normal.  ?   Breath sounds: Normal breath sounds.  ?Abdominal:  ?   General: Bowel sounds are normal. There is no distension.  ?   Palpations: There is no mass.  ?   Tenderness: There is no abdominal tenderness.  ?   Hernia: No hernia is present.  ?Musculoskeletal:     ?  General: Tenderness present. No signs of injury.  ?     Legs: ? ?Lymphadenopathy:  ?   Cervical: No cervical adenopathy.  ?Neurological:  ?   Mental Status: He is alert.  ?   Deep Tendon Reflexes:  ?   Reflex Scores: ?     Bicep reflexes are 2+ on the right side and 2+ on the left side. ?     Patellar reflexes are 2+ on the right side and 2+ on the left side. ?   Comments: Bilateral upper and lower extremity strength 5/5  ? ? ? ?No results found for any visits on 03/08/22. ? ? ? ?The 10-year ASCVD risk score (Arnett DK, et al., 2019) is: 13.5% ? ?  ?Assessment & Plan:  ? ?Problem List Items Addressed This Visit   ? ?  ? Respiratory  ? Nasal turbinate hypertrophy  ?  Has been maintained on levocetirizine, Astelin nasal spray, fluticasone nasal spray.  Continue as prescribed ? ?  ?  ? OSA (obstructive sleep  apnea)  ?  Continue using CPAP machine as directed ? ?  ?  ?  ? Digestive  ? Chronic active hepatitis (HCC)  ?  Patient has been evaluated in the past by GI.  He is started on hep C medications but had to stop due to financial restrictions.  He has not followed back up with GI.  We did discuss the importance of this.  Patient wants to call his insurance company to see which hep C treatments would be covered at a higher rate.  And then he will circle back with me in regards to getting back in with GI.  Also tried to refer for colonoscopy patient wants to defer at the current time states is not quite ready to discuss other options such as Cologuard and iFOB.  Cologuard order was placed last year but never completed.  Anticipate elevated liver functions on blood work today ? ?  ?  ?  ? Other  ? Seasonal allergies  ?  Patient currently maintained on levocetirizine, Astelin nose spray, fluticasone no spray continue medications as prescribed ? ?  ?  ? Relevant Medications  ? azelastine (ASTELIN) 0.1 % nasal spray  ? Screening for prostate cancer  ? Relevant Orders  ? PSA  ? Erectile dysfunction  ?  Unable to obtain sometimes difficult to maintain erection will use tadalafil 2.5 mg as needed seems to help some.  Continues medication as needed and as prescribed. ? ?  ?  ? Relevant Medications  ? Tadalafil 2.5 MG TABS  ? Class 1 obesity due to excess calories without serious comorbidity with body mass index (BMI) of 30.0 to 30.9 in adult  ?  Pending lab results continue  trying to make healthy lifestyle modifications ? ?  ?  ? Relevant Orders  ? Hemoglobin A1c  ? TSH  ? Lipid panel  ? Right foot pain  ?  Pending x-ray of right foot clinical exam consistent with plantar fasciitis.  Rehab exercises administered at discharge ? ?  ?  ? Relevant Orders  ? DG Foot Complete Right  ? ?Other Visit Diagnoses   ? ? Encounter to establish care with new doctor    -  Primary  ? Relevant Orders  ? CBC  ? Comprehensive metabolic panel  ?  Screening for lung cancer      ? Relevant Orders  ? Ambulatory Referral Lung Cancer Screening [facility]  ? ?  ? ? ?  Return in about 1 year (around 03/09/2023) for CPE and labs.  ? ? ?Audria Nine, NP ? ?

## 2022-03-08 NOTE — Assessment & Plan Note (Signed)
Patient currently maintained on levocetirizine, Astelin nose spray, fluticasone no spray continue medications as prescribed ?

## 2022-03-08 NOTE — Assessment & Plan Note (Signed)
Pending x-ray of right foot clinical exam consistent with plantar fasciitis.  Rehab exercises administered at discharge ?

## 2022-03-08 NOTE — Patient Instructions (Signed)
Nice to see you today I will be in touch with the labs once I have the results Follow up with me in 1 year, sooner if you need me  

## 2022-03-08 NOTE — Assessment & Plan Note (Signed)
Pending lab results continue  trying to make healthy lifestyle modifications ?

## 2022-03-09 LAB — LIPID PANEL
Cholesterol: 144 mg/dL (ref 0–200)
HDL: 40.9 mg/dL (ref >39.00)
LDL Cholesterol: 87 mg/dL (ref 0–99)
NonHDL: 102.96
Total CHOL/HDL Ratio: 4
Triglycerides: 80 mg/dL (ref 0.0–149.0)
VLDL: 16 mg/dL (ref 0.0–40.0)

## 2022-03-09 LAB — COMPREHENSIVE METABOLIC PANEL
ALT: 126 U/L — ABNORMAL HIGH (ref 0–53)
AST: 76 U/L — ABNORMAL HIGH (ref 0–37)
Albumin: 4.4 g/dL (ref 3.5–5.2)
Alkaline Phosphatase: 77 U/L (ref 39–117)
BUN: 15 mg/dL (ref 6–23)
CO2: 26 mEq/L (ref 19–32)
Calcium: 9.7 mg/dL (ref 8.4–10.5)
Chloride: 103 mEq/L (ref 96–112)
Creatinine, Ser: 0.9 mg/dL (ref 0.40–1.50)
GFR: 93.92 mL/min (ref >60.00)
Glucose, Bld: 102 mg/dL — ABNORMAL HIGH (ref 70–99)
Potassium: 4.2 mEq/L (ref 3.5–5.1)
Sodium: 139 mEq/L (ref 135–145)
Total Bilirubin: 0.8 mg/dL (ref 0.2–1.2)
Total Protein: 7.9 g/dL (ref 6.0–8.3)

## 2022-03-09 LAB — CBC
HCT: 47.3 % (ref 39.0–52.0)
Hemoglobin: 16.4 g/dL (ref 13.0–17.0)
MCHC: 34.7 g/dL (ref 30.0–36.0)
MCV: 91 fl (ref 78.0–100.0)
Platelets: 290 10*3/uL (ref 150.0–400.0)
RBC: 5.2 Mil/uL (ref 4.22–5.81)
RDW: 13.1 % (ref 11.5–15.5)
WBC: 9.5 10*3/uL (ref 4.0–10.5)

## 2022-03-09 LAB — PSA: PSA: 0.27 ng/mL (ref 0.10–4.00)

## 2022-03-09 LAB — HEMOGLOBIN A1C: Hgb A1c MFr Bld: 6 % (ref 4.6–6.5)

## 2022-03-09 LAB — TSH: TSH: 1.61 u[IU]/mL (ref 0.35–5.50)

## 2022-03-17 ENCOUNTER — Telehealth: Payer: Self-pay | Admitting: Nurse Practitioner

## 2022-03-17 NOTE — Telephone Encounter (Signed)
Called but not able to leave a message, voicemail box is full

## 2022-03-17 NOTE — Telephone Encounter (Signed)
We did have that discussion in office. I asked him to call his insurance company and see if there is a cheaper agent than the one that he was placed on. People generally do not have symptom until there is an irreversible amount of damage. The end stages of the disease can result in cancer and/or liver failure

## 2022-03-17 NOTE — Telephone Encounter (Signed)
-----   Message from Ray sent at 03/16/2022  3:03 PM EDT ----- Patient advised and viewed results on mychart. Patient already seen GI -notes in [place] in 2022, patient states he was prescribed medication that he could not afford. Patient stated "what can they do besides the work up that was already done?' Patient would like to wait on further work up at this time. He is not having any abdominal pain, no abnormal nausea, diarrhea, vomiting or jaundice.

## 2022-03-23 NOTE — Telephone Encounter (Signed)
Called not able to leave a message voicemail is full. Called Mrs Dejarnette and asked to have patient call us back

## 2022-03-29 NOTE — Telephone Encounter (Signed)
Have not been able to leave a voicemail or have patient call me back. Sent mychart message to the patient

## 2022-03-31 NOTE — Telephone Encounter (Signed)
Letter mailed to the patient

## 2022-08-05 ENCOUNTER — Telehealth: Payer: Self-pay

## 2022-08-05 NOTE — Telephone Encounter (Signed)
Prior auth started for Tadalafil 2.5MG  tablets. Ignace Pfohl Key: [id number] - PA Case ID: [id number] - Rx #: [id number] Waiting for determination.

## 2022-08-06 NOTE — Telephone Encounter (Signed)
PA denied, only covers if patient has BPH. Pharmacy advised

## 2022-08-13 NOTE — Telephone Encounter (Signed)
Spoke with patient. Patient has [place] now. They did not approve PA for Tadalafil for ED only covered if it is used for BPH. Patient states [employer] covered this in the past and I advised patient different insurance has different benefits. I advised patient that he can use GoodRX coupon for this and its around $21 to $25 for 30 tablets at [facility] or [facility]. Patient asked to check with his insurance to see if another medication would be covered. I spoke with a representative and was advised there were no ED RXs that would be covered. Patient advised. Patient was frustrated but not with [place]. Advised patient on how to look up [place] and presenting that to the pharmacy and to let us know if he wants to have this [facility] besides [facility] as it is the most expensive [facility]. Patient thanked me for checking into this and will keep [place] posted.

## 2022-08-13 NOTE — Telephone Encounter (Signed)
Patient called and stated can the medication be replaced with something else. Call back number [phone].

## 2022-12-28 ENCOUNTER — Other Ambulatory Visit: Payer: Self-pay

## 2022-12-28 DIAGNOSIS — J302 Other seasonal allergic rhinitis: Secondary | ICD-10-CM

## 2022-12-28 NOTE — Telephone Encounter (Signed)
Last visit 02/26/2022 Next visit Per Matt 1 year 03/09/2023

## 2022-12-30 ENCOUNTER — Other Ambulatory Visit: Payer: Self-pay

## 2022-12-30 MED ORDER — LEVOCETIRIZINE DIHYDROCHLORIDE 5 MG PO TABS: 5.0000 mg | ORAL_TABLET | Freq: Every evening | ORAL | 1 refills | Status: DC

## 2023-09-21 ENCOUNTER — Ambulatory Visit: Payer: BC Managed Care – PPO | Admitting: Nurse Practitioner

## 2023-09-21 ENCOUNTER — Ambulatory Visit (INDEPENDENT_AMBULATORY_CARE_PROVIDER_SITE_OTHER)
Admission: RE | Admit: 2023-09-21 | Discharge: 2023-09-21 | Disposition: A | Discharge status: Home / Self Care | Payer: BC Managed Care – PPO | Source: Ambulatory Visit | Attending: Nurse Practitioner

## 2023-09-21 ENCOUNTER — Encounter: Payer: Self-pay | Admitting: Nurse Practitioner

## 2023-09-21 VITALS — BP 124/92 | HR 102 | Temp 97.6°F | Ht 66.5 in | Wt 212.6 lb

## 2023-09-21 DIAGNOSIS — R0609 Other forms of dyspnea: Secondary | ICD-10-CM | POA: Diagnosis not present

## 2023-09-21 DIAGNOSIS — R509 Fever, unspecified: Secondary | ICD-10-CM | POA: Diagnosis not present

## 2023-09-21 DIAGNOSIS — R52 Pain, unspecified: Secondary | ICD-10-CM | POA: Insufficient documentation

## 2023-09-21 DIAGNOSIS — R051 Acute cough: Secondary | ICD-10-CM | POA: Diagnosis not present

## 2023-09-21 DIAGNOSIS — K732 Chronic active hepatitis, not elsewhere classified: Secondary | ICD-10-CM | POA: Diagnosis not present

## 2023-09-21 DIAGNOSIS — G4733 Obstructive sleep apnea (adult) (pediatric): Secondary | ICD-10-CM | POA: Diagnosis not present

## 2023-09-21 DIAGNOSIS — N529 Male erectile dysfunction, unspecified: Secondary | ICD-10-CM

## 2023-09-21 DIAGNOSIS — J343 Hypertrophy of nasal turbinates: Secondary | ICD-10-CM

## 2023-09-21 DIAGNOSIS — J302 Other seasonal allergic rhinitis: Secondary | ICD-10-CM

## 2023-09-21 DIAGNOSIS — E669 Obesity, unspecified: Secondary | ICD-10-CM

## 2023-09-21 DIAGNOSIS — J329 Chronic sinusitis, unspecified: Secondary | ICD-10-CM

## 2023-09-21 LAB — COMPREHENSIVE METABOLIC PANEL
ALT: 191 U/L — ABNORMAL HIGH (ref 0–53)
AST: 133 U/L — ABNORMAL HIGH (ref 0–37)
Albumin: 4 g/dL (ref 3.5–5.2)
Alkaline Phosphatase: 98 U/L (ref 39–117)
BUN: 9 mg/dL (ref 6–23)
CO2: 26 meq/L (ref 19–32)
Calcium: 9.1 mg/dL (ref 8.4–10.5)
Chloride: 100 meq/L (ref 96–112)
Creatinine, Ser: 0.87 mg/dL (ref 0.40–1.50)
GFR: 93.87 mL/min (ref >60.00)
Glucose, Bld: 121 mg/dL — ABNORMAL HIGH (ref 70–99)
Potassium: 4.2 meq/L (ref 3.5–5.1)
Sodium: 134 meq/L — ABNORMAL LOW (ref 135–145)
Total Bilirubin: 0.8 mg/dL (ref 0.2–1.2)
Total Protein: 7.5 g/dL (ref 6.0–8.3)

## 2023-09-21 LAB — CBC
HCT: 48.1 % (ref 39.0–52.0)
Hemoglobin: 16.2 g/dL (ref 13.0–17.0)
MCHC: 33.6 g/dL (ref 30.0–36.0)
MCV: 95.1 fL (ref 78.0–100.0)
Platelets: 256 10*3/uL (ref 150.0–400.0)
RBC: 5.06 Mil/uL (ref 4.22–5.81)
RDW: 13.6 % (ref 11.5–15.5)
WBC: 10.9 10*3/uL — ABNORMAL HIGH (ref 4.0–10.5)

## 2023-09-21 LAB — POCT FLU A/B STATUS
Influenza A, POC: NEGATIVE
Influenza B, POC: NEGATIVE

## 2023-09-21 LAB — TSH: TSH: 1.4 u[IU]/mL (ref 0.35–5.50)

## 2023-09-21 LAB — HEMOGLOBIN A1C: Hgb A1c MFr Bld: 6.5 % (ref 4.6–6.5)

## 2023-09-21 LAB — POC COVID19 BINAXNOW: SARS Coronavirus 2 Ag: NEGATIVE

## 2023-09-21 MED ORDER — LEVOCETIRIZINE DIHYDROCHLORIDE 5 MG PO TABS
5.0000 mg | ORAL_TABLET | Freq: Every evening | ORAL | 3 refills | Status: DC
Start: 2023-09-21 — End: 2023-09-21

## 2023-09-21 MED ORDER — AMOXICILLIN-POT CLAVULANATE 875-125 MG PO TABS: 1.0000 | ORAL_TABLET | Freq: Two times a day (BID) | ORAL | 0 refills | Status: DC

## 2023-09-21 MED ORDER — AMOXICILLIN-POT CLAVULANATE 875-125 MG PO TABS: 1.0000 | ORAL_TABLET | Freq: Two times a day (BID) | ORAL | 0 refills | Status: AC

## 2023-09-21 MED ORDER — LEVOCETIRIZINE DIHYDROCHLORIDE 5 MG PO TABS: 5.0000 mg | ORAL_TABLET | Freq: Every evening | ORAL | 3 refills | Status: AC

## 2023-09-21 NOTE — Assessment & Plan Note (Signed)
Patient currently doing Cialis 20 mg?  Through [facility].

## 2023-09-21 NOTE — Progress Notes (Signed)
Established Patient Office Visit  Subjective   Patient ID: Gordon Moore, male    DOB: [birth year]  Age: 62 y.o. MRN: [id number]  Chief Complaint  Patient presents with   Medication Refill    Levocetrizine    Shortness of Breath        Nasal Congestion      States that he is having mucous that feels like he is drianing and will wake up coughing up mucous and then some blood the past 2-3 days. States that he is having some blood in the mucous. States that it has been going on for the past couple of months. 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  States that he will use the nasal spray and the allergy pill  DOE has been going on and progressing. States that he is able to lay flat and does not feel short of breath  States that he will sleep with CPAP and he will wake up several times night   States that he is taking 20mg  of Cialis and it does work.  He is getting this an Forensic psychologist.       Review of Systems  Constitutional:  Negative for chills and fever.  HENT:  Positive for sore throat. Negative for ear discharge and ear pain.   Respiratory:  Positive for cough and sputum production. Negative for shortness of breath.   Cardiovascular:  Negative for chest pain.  Gastrointestinal:  Positive for vomiting. Negative for abdominal pain, constipation, diarrhea and nausea.  Musculoskeletal:  Positive for joint pain and myalgias.  Neurological:  Positive for headaches.  Psychiatric/Behavioral:  Negative for hallucinations and suicidal ideas.       Objective:     BP (!) 124/92   Pulse (!) 102   Temp 97.6 F (36.4 C) (Oral)   Ht 5' 6.5" (1.689 m)   Wt 212 lb 9.6 oz (96.4 kg)   SpO2 94%   BMI 33.80 kg/m  BP Readings from Last 3 Encounters:  09/21/23 (!) 124/92  03/08/22 134/82  06/01/21 132/85   Wt Readings from Last 3  Encounters:  09/21/23 212 lb 9.6 oz (96.4 kg)  03/08/22 196 lb 6 oz (89.1 kg)  06/01/21 203 lb 3.2 oz (92.2 kg)   SpO2 Readings from Last 3 Encounters:  09/21/23 94%  03/08/22 97%  02/11/21 97%      Physical Exam Vitals and nursing note reviewed.  Constitutional:      Appearance: Normal appearance.  HENT:     Right Ear: Tympanic membrane, ear canal and external ear normal.     Left Ear: Tympanic membrane, ear canal and external ear normal.     Nose:     Right Turbinates: Enlarged and swollen.     Left Turbinates: Enlarged and swollen.     Mouth/Throat:     Mouth: Mucous membranes are moist.     Pharynx: Posterior oropharyngeal erythema present.  Cardiovascular:     Rate and Rhythm: Normal rate and regular rhythm.     Heart sounds: Normal heart sounds.  Pulmonary:     Effort: Pulmonary effort is normal.     Breath sounds: Normal breath sounds.  Abdominal:     General: Bowel sounds are normal. There is no distension.     Palpations: There is no mass.     Tenderness: There is  no abdominal tenderness.     Hernia: No hernia is present.  Musculoskeletal:     Right lower leg: No edema.     Left lower leg: No edema.  Lymphadenopathy:     Cervical: No cervical adenopathy.  Neurological:     Mental Status: He is alert.      Results for orders placed or performed in visit on 09/21/23  POC COVID-19  Result Value Ref Range   SARS Coronavirus 2 Ag Negative Negative  POCT Flu A & B Status  Result Value Ref Range   Influenza A, POC Negative Negative   Influenza B, POC Negative Negative      The 10-year ASCVD risk score (Arnett DK, et al., 2019) is: 11.1%    Assessment & Plan:   Problem List Items Addressed This Visit       Respiratory   Nasal turbinate hypertrophy    History of the same.  Continue using Flonase nasal spray      OSA (obstructive sleep apnea)    Has been using CPAP as directed he is wondering if this is causing some of the sinus issues.   Continues a CPAP as prescribed encouraged changing out supplies and keeping CPAP clean      Sinusitis    She with Augmentin 875-125 mg twice daily for 7 days.      Relevant Medications   amoxicillin-clavulanate (AUGMENTIN) 875-125 MG tablet   levocetirizine (XYZAL) 5 MG tablet     Digestive   Chronic active hepatitis (HCC)    History of the same and started treatment but had to stop due to financial barriers.  Will check quantitative level today      Relevant Orders   HCV RNA, Quantitative Real Time PCR     Other   Obesity (BMI 30-39.9)    Pending A1c and TSH.  Continue working lifestyle modifications body habitus can contribute to dyspnea on exertion      Relevant Orders   Hemoglobin A1c   TSH   Seasonal allergies    Continue levocetirizine 5 mg as directed      Relevant Medications   levocetirizine (XYZAL) 5 MG tablet   Erectile dysfunction    Patient currently doing Cialis 20 mg?  Through [employer] [facility].      DOE (dyspnea on exertion) - Primary    Ambiguous in nature.  Will check basic labs and chest x-ray pending results      Relevant Orders   CBC   Comprehensive metabolic panel   TSH   DG Chest 2 View   Acute cough    Flu and COVID test in office.      Relevant Orders   POC COVID-19 (Completed)   POCT Flu A & B Status (Completed)   Fever and chills    Flu and COVID test in office.  Can use over-the-counter analgesics as needed      Relevant Orders   POC COVID-19 (Completed)   POCT Flu A & B Status (Completed)   Body aches    Flu and COVID test in office.  Over-the-counter analgesics as needed      Relevant Orders   POC COVID-19 (Completed)   POCT Flu A & B Status (Completed)    Return in about 6 months (around 03/20/2024) for CPE and Labs.    Audria Nine, NP

## 2023-09-21 NOTE — Assessment & Plan Note (Signed)
Flu and COVID test in office.  Over-the-counter analgesics as needed

## 2023-09-21 NOTE — Assessment & Plan Note (Signed)
History of the same and started treatment but had to stop due to financial barriers.  Will check quantitative level today

## 2023-09-21 NOTE — Assessment & Plan Note (Signed)
History of the same.  Continue using Flonase nasal spray

## 2023-09-21 NOTE — Assessment & Plan Note (Signed)
She with Augmentin 875-125 mg twice daily for 7 days.

## 2023-09-21 NOTE — Patient Instructions (Addendum)
Nice to see you today I will be in touch with the labs once I have reviewed them Follow up in 6 months for your physical

## 2023-09-21 NOTE — Assessment & Plan Note (Signed)
Has been using CPAP as directed he is wondering if this is causing some of the sinus issues.  Continues a CPAP as prescribed encouraged changing out supplies and keeping CPAP clean

## 2023-09-21 NOTE — Assessment & Plan Note (Signed)
Continue levocetirizine 5 mg as directed

## 2023-09-21 NOTE — Assessment & Plan Note (Signed)
Pending A1c and TSH.  Continue working lifestyle modifications body habitus can contribute to dyspnea on exertion

## 2023-09-21 NOTE — Assessment & Plan Note (Signed)
Flu and COVID test in office.

## 2023-09-21 NOTE — Assessment & Plan Note (Signed)
Flu and COVID test in office.  Can use over-the-counter analgesics as needed

## 2023-09-21 NOTE — Assessment & Plan Note (Signed)
Ambiguous in nature.  Will check basic labs and chest x-ray pending results

## 2023-09-23 LAB — HEPATITIS C RNA QUANTITATIVE
HCV Quantitative Log: 6.12 {Log_IU}/mL — ABNORMAL HIGH
HCV RNA, PCR, QN: 1320000 [IU]/mL — ABNORMAL HIGH

## 2023-09-25 ENCOUNTER — Other Ambulatory Visit: Payer: Self-pay | Admitting: Nurse Practitioner

## 2023-09-25 DIAGNOSIS — K732 Chronic active hepatitis, not elsewhere classified: Secondary | ICD-10-CM

## 2023-09-25 DIAGNOSIS — E119 Type 2 diabetes mellitus without complications: Secondary | ICD-10-CM

## 2023-09-26 ENCOUNTER — Telehealth: Payer: Self-pay | Admitting: Nurse Practitioner

## 2023-09-26 DIAGNOSIS — K732 Chronic active hepatitis, not elsewhere classified: Secondary | ICD-10-CM

## 2023-09-26 NOTE — Telephone Encounter (Signed)
Referral placed.

## 2023-09-26 NOTE — Telephone Encounter (Signed)
-----   Message from [facility] sent at 09/26/2023  2:36 PM EST ----- Called patient and reviewed all information. Patient verbalized understanding. Will call if any further questions. Patient would like referral to ID got to [facility] ID Dr. Orlene Erm. Scheduled 3 mo DM f/u

## 2023-10-05 ENCOUNTER — Encounter: Payer: Self-pay | Admitting: *Deleted

## 2023-10-18 IMAGING — DX DG FOOT COMPLETE 3+V*R*
3 series · 3 of 3 positions shown · non-contrast
Comparison: None Available.

CLINICAL DATA: Right heel pain.

EXAM:
RIGHT FOOT COMPLETE - 3+ VIEW

[foot ap]
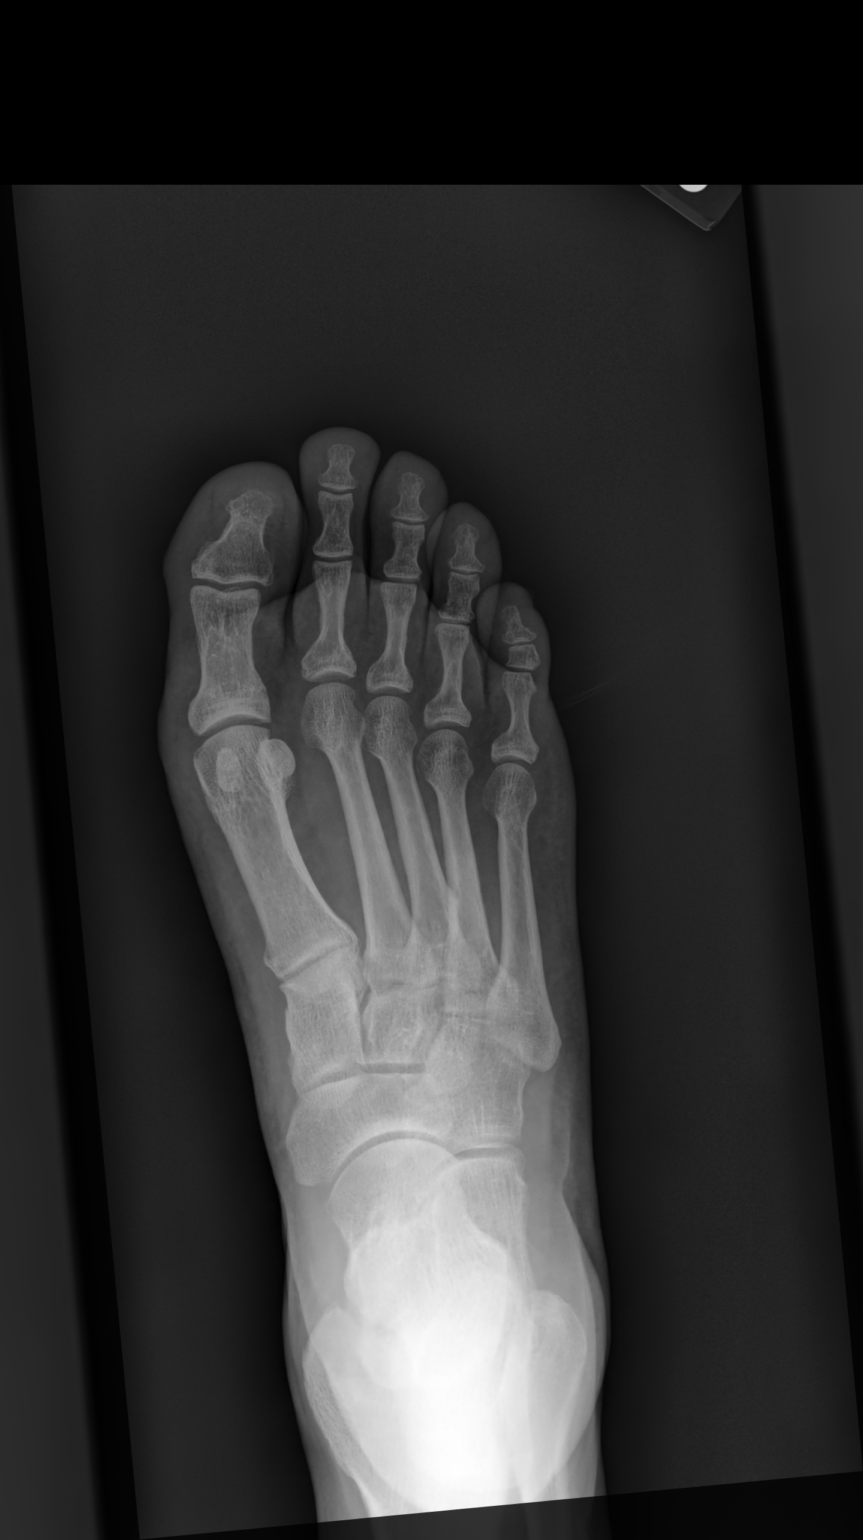

[foot mlo]
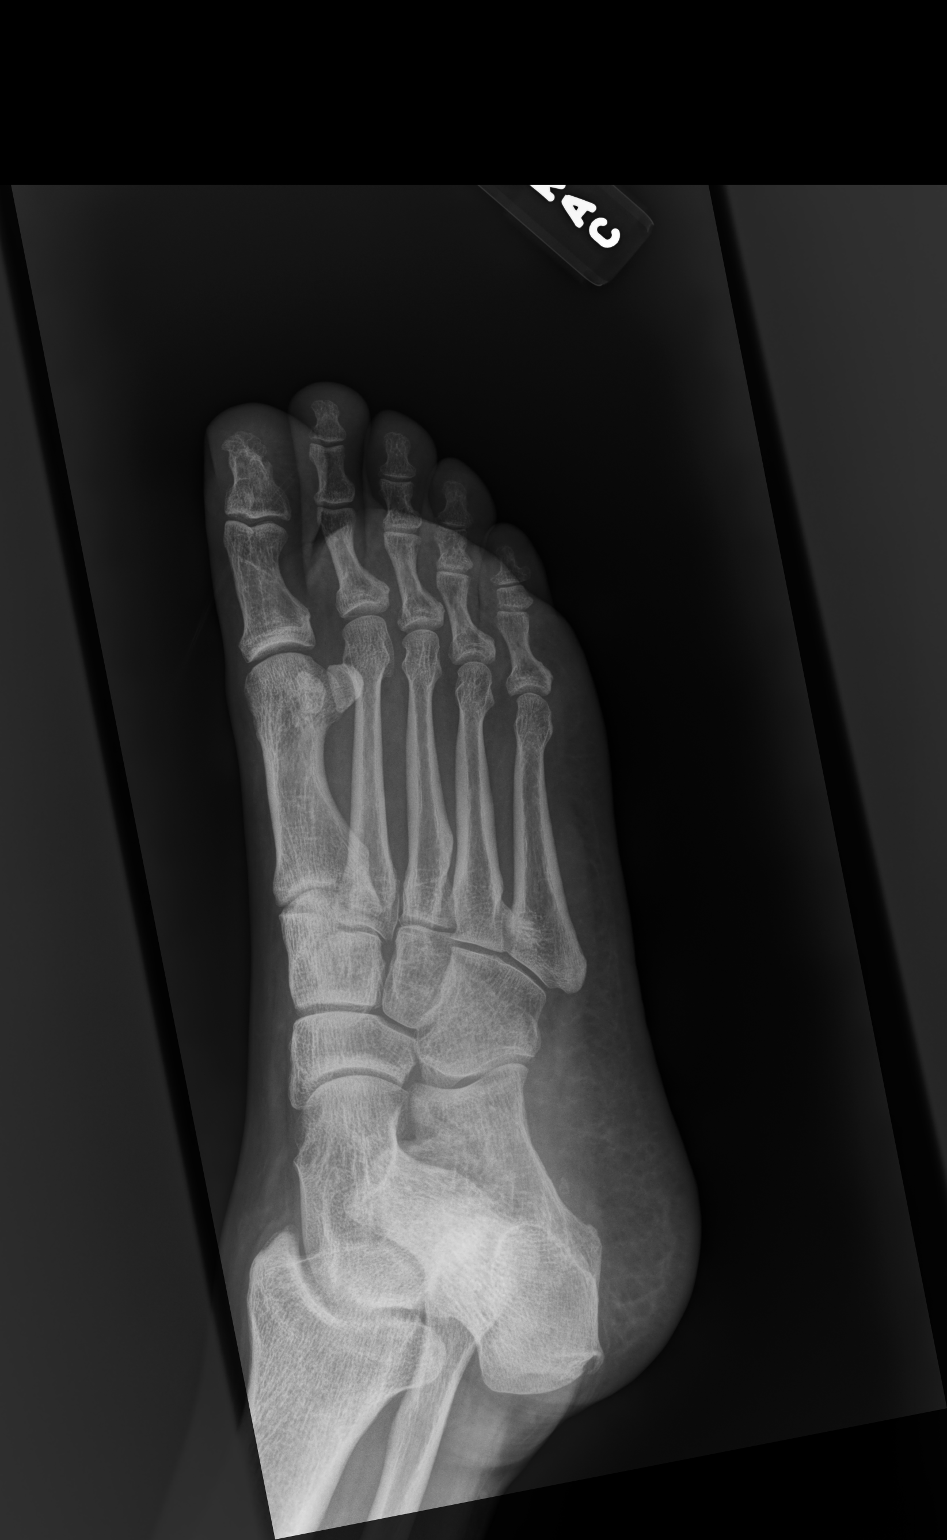

[foot lat]
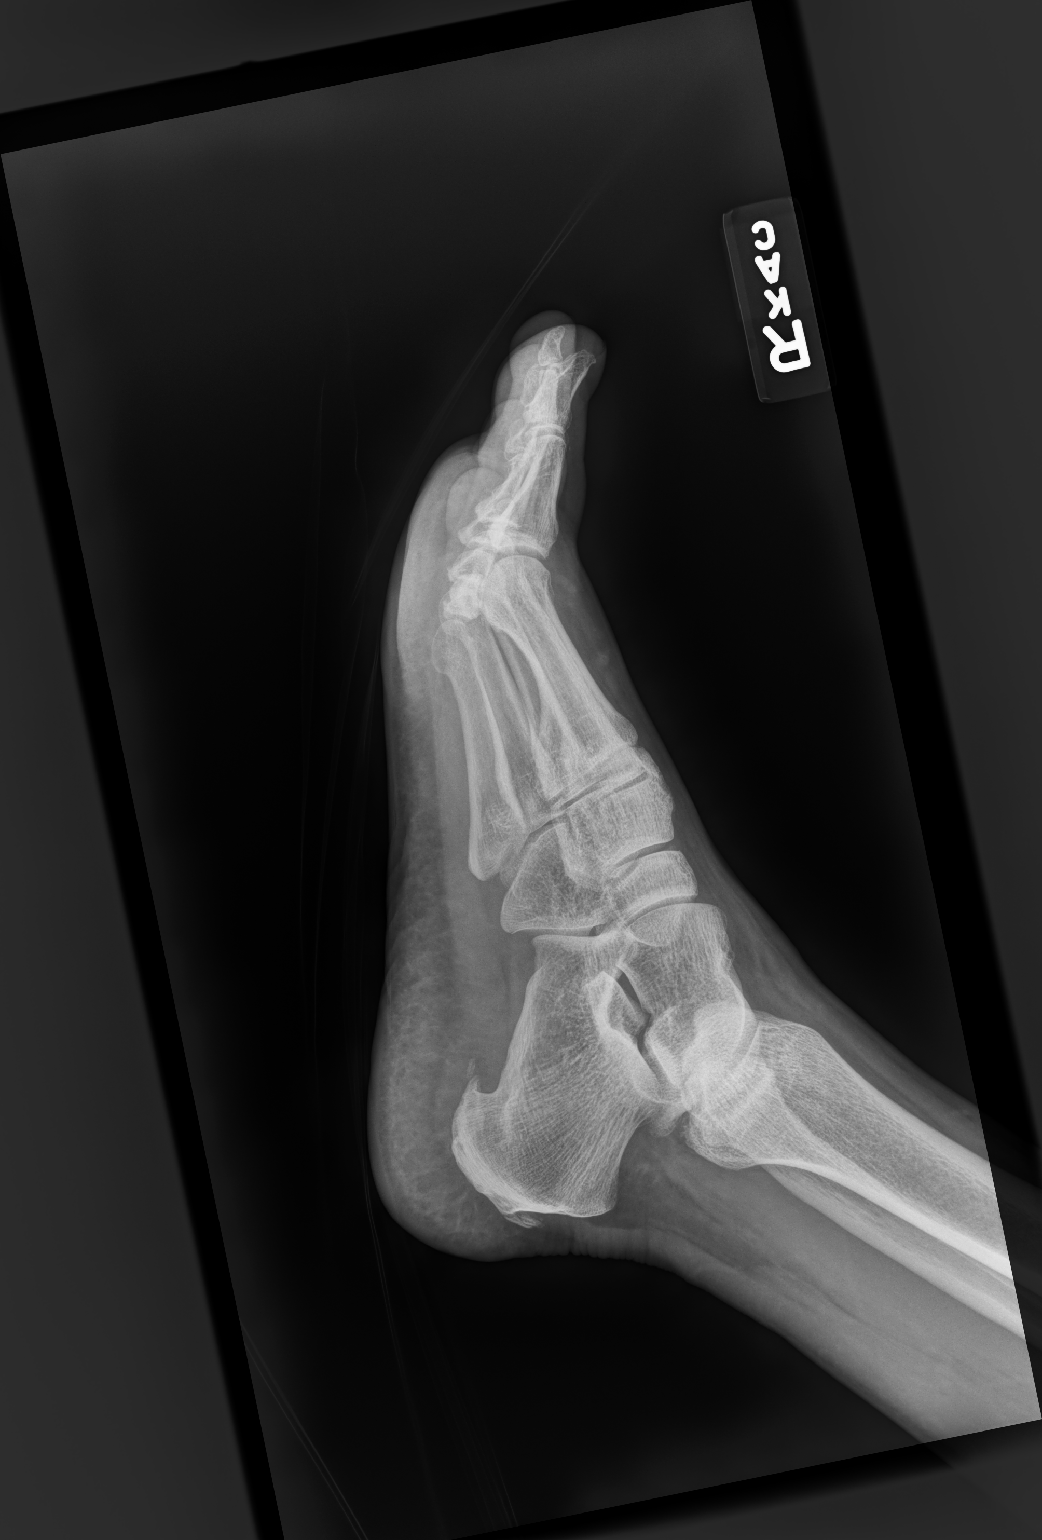

[3 of 3 positions shown; findings below may reference images not displayed]

FINDINGS: There is normal bone mineralization with no evidence of fractures.

There is mild arthrosis of the tarsometatarsal joints. Arthritic
changes are not otherwise seen.

There are moderate-sized posterior and plantar calcaneal
enthesopathic spurs without erosive changes.

There is a small well corticated cylindrical ossicle in the plantar
fascia just anterior to the heel spur which can be seen with chronic
inflammation or with prior trauma with dystrophic calcification.

No radiopaque foreign body is seen.  No focal soft tissue swelling.
IMPRESSION: 1. No acute abnormality is seen.
2. Calcaneal enthesopathy.
3. Small well corticated ossicle in the plantar fascia which may be
seen with chronic inflammation or old trauma.
4. Mild arthrosis of the tarsometatarsal joints.

## 2023-12-26 ENCOUNTER — Encounter: Payer: Self-pay | Admitting: Nurse Practitioner

## 2023-12-26 ENCOUNTER — Ambulatory Visit: Payer: BC Managed Care – PPO | Admitting: Nurse Practitioner

## 2023-12-26 ENCOUNTER — Telehealth: Payer: Self-pay | Admitting: Nurse Practitioner

## 2023-12-26 VITALS — BP 120/78 | HR 84 | Temp 98.5°F | Ht 66.5 in | Wt 211.2 lb

## 2023-12-26 DIAGNOSIS — J343 Hypertrophy of nasal turbinates: Secondary | ICD-10-CM | POA: Diagnosis not present

## 2023-12-26 DIAGNOSIS — E119 Type 2 diabetes mellitus without complications: Secondary | ICD-10-CM | POA: Diagnosis not present

## 2023-12-26 DIAGNOSIS — R0609 Other forms of dyspnea: Secondary | ICD-10-CM | POA: Diagnosis not present

## 2023-12-26 HISTORY — DX: Type 2 diabetes mellitus without complications: E11.9

## 2023-12-26 LAB — POCT GLYCOSYLATED HEMOGLOBIN (HGB A1C): Hemoglobin A1C: 6.4 % — AB (ref 4.0–5.6)

## 2023-12-26 NOTE — Telephone Encounter (Signed)
 Contacted Orlene Erm, MD office to relay information to staff that pt is unable to get hep c medication due to insurance. Staff verbalized understanding and documented information to get back to Orlene Erm, MD.

## 2023-12-26 NOTE — Patient Instructions (Signed)
 I want you to use the flonase (fluticasone) and Xyzal (levocetrizine) every day.  If that does not help then we can try a prescription inhaler.  Follow up with me in 6 months for a sugar recheck and physical

## 2023-12-26 NOTE — Assessment & Plan Note (Signed)
 Patient's last A1c was 6.5%.  Today's A1c 6.4%.  Will continue monitoring at this juncture without medication.  Continue work on healthy lifestyle modifications.  Patient was able to make the nutrition visit but did get resources through the mail.

## 2023-12-26 NOTE — Assessment & Plan Note (Signed)
 Unclear etiology.  Does sound upper airway encourage patient use Flonase daily along with daily antihistamine.  If this is not beneficial consider using Advair or Symbicort as patient does have a history of smoking.  He is followed by pulmonology for OSA.  If not beneficial consider cardiology although patient is a candidate for ENT surgery

## 2023-12-26 NOTE — Progress Notes (Signed)
 Established Patient Office Visit  Subjective   Patient ID: Gordon Moore, male    DOB: [birth year]  Age: 64 y.o. MRN: [id number]  Chief Complaint  Patient presents with   Diabetes      DM2: he is currently on lifestyle modifications soley. Last A!C was 6.5 precent. He was referred to diabetic educator servicees. States that he was contacted by DM services. He was unable to go   Diet: he has been working on his diet. He is cutting back on the sugar and fried foods. He is incorperating salads. States that he use to track his nutrition. He is drinkng water, coffee, tea, gatorades.   Exercise: states that he does walk but he does have some shortness of breath. He does like to hike  [facility]: he does use a CPAP and is followed by specialist. States that it has been waxing and waning. States that he feels like he is breathing throught a straw and then others a pinched straw in the past. States that he has seen Dr. Jenne Pane, ENT in the past and is a candidate for sinus surgery.     Review of Systems  Constitutional:  Negative for chills and fever.  Respiratory:  Positive for cough and shortness of breath.   Cardiovascular:  Negative for chest pain.  Gastrointestinal:  Positive for nausea.  Neurological:  Negative for headaches.  Psychiatric/Behavioral:  Negative for hallucinations and suicidal ideas.       Objective:     BP 120/78   Pulse 84   Temp 98.5 F (36.9 C) (Oral)   Ht 5' 6.5" (1.689 m)   Wt 211 lb 3.2 oz (95.8 kg)   SpO2 95%   BMI 33.58 kg/m    Physical Exam Vitals and nursing note reviewed.  Constitutional:      Appearance: Normal appearance.  HENT:     Nose:     Right Sinus: No maxillary sinus tenderness or frontal sinus tenderness.     Left Sinus: No maxillary sinus tenderness or frontal sinus tenderness.  Cardiovascular:     Rate and Rhythm: Normal rate and regular rhythm.     Heart sounds: Normal heart sounds.  Pulmonary:     Effort: Pulmonary effort is  normal.     Breath sounds: Normal breath sounds.  Neurological:     Mental Status: He is alert.      Results for orders placed or performed in visit on 12/26/23  POCT glycosylated hemoglobin (Hb A1C)  Result Value Ref Range   Hemoglobin A1C 6.4 (A) 4.0 - 5.6 %   HbA1c POC (<> result, manual entry)     HbA1c, POC (prediabetic range)     HbA1c, POC (controlled diabetic range)        The 10-year ASCVD risk score (Arnett DK, et al., 2019) is: 19.4%    Assessment & Plan:   Problem List Items Addressed This Visit       Respiratory   Nasal turbinate hypertrophy     Endocrine   Well controlled type 2 diabetes mellitus (HCC) - Primary   Patient's last A1c was 6.5%.  Today's A1c 6.4%.  Will continue monitoring at this juncture without medication.  Continue work on healthy lifestyle modifications.  Patient was able to make the nutrition visit but did get resources through the mail.      Relevant Orders   POCT glycosylated hemoglobin (Hb A1C) (Completed)     Other   DOE (dyspnea on exertion)   Unclear etiology.  Does sound upper airway encourage patient use Flonase daily along with daily antihistamine.  If this is not beneficial consider using Advair or Symbicort as patient does have a history of smoking.  He is followed by pulmonology for OSA.  If not beneficial consider cardiology although patient is a candidate for ENT surgery       Return in about 6 months (around 06/27/2024) for CPE and Labs, DM recheck.    Audria Nine, NP

## 2023-12-26 NOTE — Telephone Encounter (Signed)
 Can we reach out to Gordon Erm, MD office and let them know the patient has not been able to get the Hep C medication due to insurance

## 2024-01-09 ENCOUNTER — Telehealth: Payer: Self-pay | Admitting: Nurse Practitioner

## 2024-01-09 DIAGNOSIS — J302 Other seasonal allergic rhinitis: Secondary | ICD-10-CM

## 2024-01-09 NOTE — Telephone Encounter (Signed)
 Can  we see if the patient has been using the xyzal and flonses consistently and if it has helped?

## 2024-01-09 NOTE — Telephone Encounter (Signed)
-----   Message from [employer] sent at 12/26/2023  9:15 AM EST ----- Regarding: DOE See if consistent use of the flonase and xzyal helped. If not considerd advair or symbicort with history of smoking

## 2024-01-10 NOTE — Telephone Encounter (Signed)
 Called pt. No answer and Unable to leave voicemail.

## 2024-01-12 NOTE — Telephone Encounter (Signed)
 No answer voicemail full. Sending my chart message with questions.

## 2024-01-24 NOTE — Telephone Encounter (Signed)
 Patient states Flonase has expired and they need a new one sent.  He purchases the other OTC

## 2024-01-24 NOTE — Telephone Encounter (Signed)
 Patient has called Dr Servando Snare office and no one has reached back out to him

## 2024-01-25 MED ORDER — FLUTICASONE PROPIONATE 50 MCG/ACT NA SUSP: 2.0000 | Freq: Every day | NASAL | 1 refills | Status: DC

## 2024-01-25 NOTE — Telephone Encounter (Signed)
 Contacted Dr. Vilma Prader office.  Rep stated that she will direct message to Dr.Wohl and his nurse.  Receptionist stated that they will give the patient a call regarding medication.

## 2024-01-25 NOTE — Addendum Note (Signed)
 Addended by: Eden Emms on: 01/25/2024 01:56 PM   Modules accepted: Orders

## 2024-01-25 NOTE — Telephone Encounter (Signed)
 Can we try calling Remi Haggard office. I cannot message him in Epic

## 2024-02-08 ENCOUNTER — Other Ambulatory Visit: Payer: Self-pay | Admitting: Nurse Practitioner

## 2024-02-08 DIAGNOSIS — J302 Other seasonal allergic rhinitis: Secondary | ICD-10-CM

## 2024-02-09 ENCOUNTER — Ambulatory Visit: Payer: Self-pay

## 2024-02-09 NOTE — Telephone Encounter (Addendum)
 3rd attempt, mailbox full. Routing to clinic for follow up.  2nd attempt, mailbox full

## 2024-02-09 NOTE — Telephone Encounter (Signed)
 1st attempt. Mailbox is full.   Copied from CRM [id number]. Topic: Clinical - Medication Question >> Feb 09, 2024  1:02 PM Rosamond Comes wrote: Reason for CRM: patient calling in [facility] medication was approved epclusi.  Patient has questions about this medication. Please call patient  regarding this issue  phone [phone]

## 2024-02-13 NOTE — Telephone Encounter (Signed)
 Tried to call pt. VM is full. Will send a MyChart message.

## 2024-05-28 DIAGNOSIS — B1920 Unspecified viral hepatitis C without hepatic coma: Secondary | ICD-10-CM

## 2024-05-28 HISTORY — DX: Unspecified viral hepatitis C without hepatic coma: B19.20

## 2024-05-31 ENCOUNTER — Telehealth: Payer: Self-pay

## 2024-05-31 DIAGNOSIS — G4733 Obstructive sleep apnea (adult) (pediatric): Secondary | ICD-10-CM

## 2024-05-31 NOTE — Telephone Encounter (Signed)
 Looks like pulmonary set him up on 2022 after a sleep study.

## 2024-05-31 NOTE — Telephone Encounter (Signed)
 Copied from CRM #[id number]. Topic: Referral - Question >> May 31, 2024  3:14 PM Paige D wrote: Reason for CRM: Pt is calling because his CPAP is no longer working, pt has new insurance pt does not know what to do. He is asking if he needs a new referral due to new insurance or if he can go back to his previous sleep Dr. And get another CPAP there. Pt would like Dr. Wendee to reach out in regards on what to do in this situation.

## 2024-06-01 NOTE — Telephone Encounter (Signed)
 Called pt. No answer and Unable to leave voicemail.

## 2024-06-01 NOTE — Telephone Encounter (Signed)
 Pt called back.  Relayed info that referral was placed for pt for pulmonology.  Pt verbalized understanding and has no questions or concerns.

## 2024-06-01 NOTE — Addendum Note (Signed)
 Addended by: WENDEE LYNWOOD HERO on: 06/01/2024 12:49 PM   Modules accepted: Orders

## 2024-06-01 NOTE — Telephone Encounter (Signed)
 Referral placed back to pulmonology

## 2024-06-04 ENCOUNTER — Ambulatory Visit (INDEPENDENT_AMBULATORY_CARE_PROVIDER_SITE_OTHER): Admitting: Sleep Medicine

## 2024-06-04 ENCOUNTER — Encounter: Payer: Self-pay | Admitting: Sleep Medicine

## 2024-06-04 VITALS — BP 126/70 | HR 73 | Temp 98.0°F | Ht 67.0 in | Wt 200.2 lb

## 2024-06-04 DIAGNOSIS — Z6831 Body mass index (BMI) 31.0-31.9, adult: Secondary | ICD-10-CM | POA: Diagnosis not present

## 2024-06-04 DIAGNOSIS — E669 Obesity, unspecified: Secondary | ICD-10-CM

## 2024-06-04 DIAGNOSIS — Z87891 Personal history of nicotine dependence: Secondary | ICD-10-CM | POA: Diagnosis not present

## 2024-06-04 DIAGNOSIS — G4733 Obstructive sleep apnea (adult) (pediatric): Secondary | ICD-10-CM | POA: Diagnosis not present

## 2024-06-04 NOTE — Patient Instructions (Signed)
 Gordon Moore

## 2024-06-04 NOTE — Progress Notes (Signed)
 Name:Gordon Moore MRN: [id number] DOB: 14-Dec-1960   CHIEF COMPLAINT:  REASSESSMENT OF OSA   HISTORY OF PRESENT ILLNESS:  Gordon Moore is a 63 y.o. w/ a h/o OSA, Hepatitis C and obesity who presents for reassessment of OSA. Reports that he was initially diagnosed with severe OSA in 2022 and subsequently started on CPAP therapy. Reports that he used CPAP therapy consistently until the device malfunctioned a few months ago. Reports c/o loud snoring and excessive daytime sleepiness. Reports nocturnal awakenings due to nocturia, however does not have difficulty falling back to sleep. Denies any significant weight changes. Admits to night sweats. Denies morning headaches, RLS symptoms, dream enactment, cataplexy, hypnagogic or hypnapompic hallucinations. Denies a family history of sleep apnea. Denies drowsy driving. Drinks 3-4 cups of coffee or tea daily, occasional alcohol use, vapes throughout the day, uses marijuana occasionally.   Bedtime 1 am Sleep onset 5 mins Rise time 8 am   EPWORTH SLEEP SCORE     06/04/2024    9:00 AM  Results of the Epworth flowsheet  Sitting and reading 1  Watching TV 2  Sitting, inactive in a public place (e.g. a theatre or a meeting) 1  As a passenger in a car for an hour without a break 1  Lying down to rest in the afternoon when circumstances permit 1  Sitting and talking to someone 0  Sitting quietly after a lunch without alcohol 0  In a car, while stopped for a few minutes in traffic 0  Total score 6     PAST MEDICAL HISTORY :   has a past medical history of Allergy, Hepatitis C, Hepatitis C infection (05/28/2024), Neuromuscular disorder (HCC) (2020), Sleep apnea, and Well controlled type 2 diabetes mellitus (HCC) (12/26/2023).  has a past surgical history that includes Hand surgery (Left). Prior to Admission medications   Medication Sig Start Date End Date Taking? Authorizing Provider  fluticasone  (FLONASE ) 50 MCG/ACT nasal spray SPRAY 2 SPRAYS  INTO EACH NOSTRIL EVERY DAY 02/08/24  Yes Wendee Lynwood HERO, NP  levocetirizine (XYZAL ) 5 MG tablet Take 1 tablet (5 mg total) by mouth every evening. 09/21/23  Yes Wendee Lynwood HERO, NP  Sofosbuvir-Velpatasvir 400-100 MG TABS Take 1 tablet by mouth daily. 12/19/23  Yes [provider]  Tadalafil  2.5 MG TABS Take 1 tablet (2.5 mg total) by mouth daily. 03/08/22  Yes Wendee Lynwood HERO, NP  azelastine  (ASTELIN ) 0.1 % nasal spray Place 2 sprays into both nostrils 2 (two) times daily. Use in each nostril as directed Patient not taking: Reported on 06/04/2024 03/08/22   Wendee Lynwood HERO, NP   Allergies  Allergen Reactions   Other     Patient reports seasonal allergies.    FAMILY HISTORY:  family history includes Diabetes in his mother; Gout in his father and sister; Kidney disease in his father; Other in his father and mother. SOCIAL HISTORY:  reports that he quit smoking about 3 years ago. His smoking use included cigarettes. He started smoking about 23 years ago. He has a 20 pack-year smoking history. He has never used smokeless tobacco. He reports current alcohol use of about 3.0 standard drinks of alcohol per week. He reports that he does not currently use drugs after having used the following drugs: Cocaine and Marijuana.   Review of Systems:  Gen:  Denies  fever, sweats, chills weight loss  HEENT: Denies blurred vision, double vision, ear pain, eye pain, hearing loss, nose bleeds, sore throat Cardiac:  No dizziness, chest pain or heaviness, chest tightness,edema, No JVD Resp:   No cough, -sputum production, -shortness of breath,-wheezing, -hemoptysis,  Gi: Denies swallowing difficulty, stomach pain, nausea or vomiting, diarrhea, constipation, bowel incontinence Gu:  Denies bladder incontinence, burning urine Ext:   Denies Joint pain, stiffness or swelling Skin: Denies  skin rash, easy bruising or bleeding or hives Endoc:  Denies polyuria, polydipsia , polyphagia or weight change Psych:    Denies depression, insomnia or hallucinations  Other:  All other systems negative  VITAL SIGNS: BP 126/70 (BP Location: Right Arm, Patient Position: Sitting, Cuff Size: Large)   Pulse 73   Temp 98 F (36.7 C) (Oral)   Ht 5' 7 (1.702 m)   Wt 200 lb 3.2 oz (90.8 kg)   SpO2 97%   BMI 31.36 kg/m    Physical Examination:   General Appearance: No distress  EYES PERRLA, EOM intact.   NECK Supple, No JVD Pulmonary: normal breath sounds, No wheezing.  CardiovascularNormal S1,S2.  No m/r/g.   Abdomen: Benign, Soft, non-tender. Skin:   warm, no rashes, no ecchymosis  Extremities: normal, no cyanosis, clubbing. Neuro:without focal findings,  speech normal  PSYCHIATRIC: Mood, affect within normal limits.   ASSESSMENT AND PLAN  OSA Due to patient's CPAP device malfunctioning, will reassess apnea with HST. Discussed the consequences of untreated sleep apnea. Advised not to drive drowsy for safety of patient and others. Will complete further evaluation with a home sleep study and follow up to review results.    Obesity Counseled patient on diet and lifestyle modification.    MEDICATION ADJUSTMENTS/LABS AND TESTS ORDERED: Recommend Sleep Study   Patient  satisfied with Plan of action and management. All questions answered  Follow up to review HST results and treatment plan.   I spent a total of 54 minutes reviewing chart data, face-to-face evaluation with the patient, counseling and coordination of care as detailed above.    Tecia Cinnamon, M.D.  Sleep Medicine [facility]

## 2024-06-20 ENCOUNTER — Encounter

## 2024-06-20 DIAGNOSIS — G4733 Obstructive sleep apnea (adult) (pediatric): Secondary | ICD-10-CM

## 2024-06-27 ENCOUNTER — Encounter: Payer: Self-pay | Admitting: Nurse Practitioner

## 2024-06-27 ENCOUNTER — Encounter: Admitting: Nurse Practitioner

## 2024-06-27 ENCOUNTER — Ambulatory Visit (INDEPENDENT_AMBULATORY_CARE_PROVIDER_SITE_OTHER): Admitting: Nurse Practitioner

## 2024-06-27 VITALS — BP 114/72 | HR 99 | Temp 98.1°F | Ht 66.95 in | Wt 206.2 lb

## 2024-06-27 DIAGNOSIS — Z0000 Encounter for general adult medical examination without abnormal findings: Secondary | ICD-10-CM | POA: Diagnosis not present

## 2024-06-27 DIAGNOSIS — G4733 Obstructive sleep apnea (adult) (pediatric): Secondary | ICD-10-CM

## 2024-06-27 DIAGNOSIS — J302 Other seasonal allergic rhinitis: Secondary | ICD-10-CM | POA: Diagnosis not present

## 2024-06-27 DIAGNOSIS — E669 Obesity, unspecified: Secondary | ICD-10-CM | POA: Diagnosis not present

## 2024-06-27 DIAGNOSIS — Z125 Encounter for screening for malignant neoplasm of prostate: Secondary | ICD-10-CM | POA: Diagnosis not present

## 2024-06-27 DIAGNOSIS — E119 Type 2 diabetes mellitus without complications: Secondary | ICD-10-CM

## 2024-06-27 DIAGNOSIS — Z23 Encounter for immunization: Secondary | ICD-10-CM | POA: Diagnosis not present

## 2024-06-27 DIAGNOSIS — Z87891 Personal history of nicotine dependence: Secondary | ICD-10-CM

## 2024-06-27 DIAGNOSIS — Z122 Encounter for screening for malignant neoplasm of respiratory organs: Secondary | ICD-10-CM

## 2024-06-27 DIAGNOSIS — Z1211 Encounter for screening for malignant neoplasm of colon: Secondary | ICD-10-CM

## 2024-06-27 NOTE — Patient Instructions (Signed)
 Nice to see you today  We did up date your flu and pneumonia vaccines today Follow up with me in 1 year  If you do the weight loss pills it will need to be 30 days.

## 2024-06-27 NOTE — Assessment & Plan Note (Signed)
 Pending urine microscopy rule out microscopic hematuria

## 2024-06-27 NOTE — Assessment & Plan Note (Signed)
 Pending TSH, A1c, lipid panel.  Consider doing low-dose phentermine  to aid in weight loss.  Did discuss this with patient in office

## 2024-06-27 NOTE — Progress Notes (Signed)
 Established Patient Office Visit  Subjective   Patient ID: Gordon Moore, male    DOB: [birth year]  Age: 63 y.o. MRN: [id number]  Chief Complaint  Patient presents with   Annual Exam    HPI  for complete physical and follow up of chronic conditions.  DM2: Patient currently maintained on diet alone  Allergies: Patient currently maintained on levocetirizine and Astelin  nasal spray  Hepatitis C: Patient currently followed by [facility] infectious disease and completed a course of sofosbuvir-velpatasvir.  Immunizations: -Tetanus: Completed in wihtin 5 years -Influenza: Update today -Shingles: get at next office visit  -Pneumonia: up date today   Diet: Fair diet. He was eating a carnivore diet. States that he is doing 2 meals and then the third meal is late Exercise:states that he is doing walking and he is adding on dumbells some   Eye exam: needs updating and wears glasses  Dental exam: Completes semi-annually    Colonoscopy: Cologuard ordered today  Lung Cancer Screening: Amb refer placed   PSA: Due  Sleep: going to bed around 1am and will get up around 8. Does not feel rested. Does snore.  Does have a CPAP and it has been broken. Recently repeated sleep study and is followed by pulmonology       Review of Systems  Constitutional:  Negative for chills and fever.  Respiratory:  Negative for shortness of breath.   Cardiovascular:  Negative for chest pain and leg swelling.  Gastrointestinal:  Negative for abdominal pain, blood in stool, constipation, diarrhea, nausea and vomiting.       Bm daily to every other day   Genitourinary:  Negative for dysuria and hematuria.  Neurological:  Negative for tingling and headaches.  Psychiatric/Behavioral:  Negative for hallucinations and suicidal ideas.       Objective:     BP 114/72   Pulse 99   Temp 98.1 F (36.7 C) (Oral)   Ht 5' 6.95 (1.701 m)   Wt 206 lb 3.2 oz (93.5 kg)   SpO2 98%   BMI 32.34 kg/m  BP Readings from  Last 3 Encounters:  06/27/24 114/72  06/04/24 126/70  12/26/23 120/78   Wt Readings from Last 3 Encounters:  06/27/24 206 lb 3.2 oz (93.5 kg)  06/04/24 200 lb 3.2 oz (90.8 kg)  12/26/23 211 lb 3.2 oz (95.8 kg)   SpO2 Readings from Last 3 Encounters:  06/27/24 98%  06/04/24 97%  12/26/23 95%      Physical Exam Vitals and nursing note reviewed.  Constitutional:      Appearance: Normal appearance.  HENT:     Right Ear: Tympanic membrane, ear canal and external ear normal.     Left Ear: Tympanic membrane, ear canal and external ear normal.     Mouth/Throat:     Mouth: Mucous membranes are moist.     Pharynx: Oropharynx is clear.  Eyes:     Extraocular Movements: Extraocular movements intact.     Pupils: Pupils are equal, round, and reactive to light.  Cardiovascular:     Rate and Rhythm: Normal rate and regular rhythm.     Pulses: Normal pulses.     Heart sounds: Normal heart sounds.  Pulmonary:     Effort: Pulmonary effort is normal.     Breath sounds: Normal breath sounds.  Abdominal:     General: Bowel sounds are normal. There is no distension.     Palpations: There is no mass.     Tenderness: There is no  abdominal tenderness.     Hernia: No hernia is present.  Genitourinary:    Comments: deferred Musculoskeletal:     Right lower leg: No edema.     Left lower leg: No edema.  Lymphadenopathy:     Cervical: No cervical adenopathy.  Skin:    General: Skin is warm.  Neurological:     General: No focal deficit present.     Mental Status: He is alert.     Deep Tendon Reflexes:     Reflex Scores:      Bicep reflexes are 2+ on the right side and 2+ on the left side.      Patellar reflexes are 2+ on the right side and 2+ on the left side.    Comments: Bilateral upper and lower extremity strength 5/5  Psychiatric:        Mood and Affect: Mood normal.        Behavior: Behavior normal.        Thought Content: Thought content normal.        Judgment: Judgment normal.       No results found for any visits on 06/27/24.    The 10-year ASCVD risk score (Arnett DK, et al., 2019) is: 12.4%    Assessment & Plan:   Problem List Items Addressed This Visit       Respiratory   OSA (obstructive sleep apnea)   History of the same.  Patient followed up oncology.  Recently did a sleep study pending results continue following with specialist as recommended        Endocrine   Well controlled type 2 diabetes mellitus (HCC)   Pending A1c today.  Currently maintained on lifestyle modifications only      Relevant Orders   CBC with Differential/Platelet   Comprehensive metabolic panel with GFR   Hemoglobin A1c   Lipid panel   Microalbumin / creatinine urine ratio     Other   Obesity (BMI 30-39.9)   Pending TSH, A1c, lipid panel.  Consider doing low-dose phentermine  to aid in weight loss.  Did discuss this with patient in office      Seasonal allergies   History of the same currently maintained on levocetirizine and Astelin  nasal spray.  Continue      Preventative health care - Primary   Discussed age-appropriate immunizations and screening exams.  Did review patient's personal, surgical, social, family histories.  Patient is up-to-date with all age-appropriate vaccinations he would like.  Update flu vaccine and pneumonia vaccine today.  Will get shingles vaccine at next office visit.  Ordered Cologuard for CRC screening.  PSA for prostate cancer screening.  Referral to lung cancer screening ordered today.  Patient was given information at discharge about preventative healthcare maintenance with anticipatory guidance.      Relevant Orders   CBC with Differential/Platelet   Comprehensive metabolic panel with GFR   TSH   Former smoker   Pending urine microscopy rule out microscopic hematuria.      Relevant Orders   Pneumococcal conjugate vaccine 20-valent (Prevnar 20) (Completed)   Urine Microscopic   Other Visit Diagnoses       Screening for  colon cancer       Relevant Orders   Cologuard     Screening for lung cancer       Relevant Orders   Ambulatory Referral Lung Cancer Screening [facility]     Need for pneumococcal 20-valent conjugate vaccination       Relevant Orders  Pneumococcal conjugate vaccine 20-valent (Prevnar 20) (Completed)     Need for influenza vaccination       Relevant Orders   Flu vaccine trivalent PF, 6mos and older(Flulaval,Afluria,Fluarix,Fluzone) (Completed)       Return in about 4 weeks (around 07/25/2024) for weight recheck .    Adina Crandall, NP

## 2024-06-27 NOTE — Assessment & Plan Note (Signed)
 History of the same currently maintained on levocetirizine and Astelin  nasal spray.  Continue

## 2024-06-27 NOTE — Assessment & Plan Note (Signed)
 History of the same.  Patient followed up oncology.  Recently did a sleep study pending results continue following with specialist as recommended

## 2024-06-27 NOTE — Assessment & Plan Note (Signed)
 Discussed age-appropriate immunizations and screening exams.  Did review patient's personal, surgical, social, family histories.  Patient is up-to-date with all age-appropriate vaccinations he would like.  Update flu vaccine and pneumonia vaccine today.  Will get shingles vaccine at next office visit.  Ordered Cologuard for CRC screening.  PSA for prostate cancer screening.  Referral to lung cancer screening ordered today.  Patient was given information at discharge about preventative healthcare maintenance with anticipatory guidance.

## 2024-06-27 NOTE — Assessment & Plan Note (Signed)
 Pending A1c today.  Currently maintained on lifestyle modifications only

## 2024-06-28 LAB — CBC WITH DIFFERENTIAL/PLATELET
Basophils Absolute: 0 K/uL (ref 0.0–0.1)
Basophils Relative: 0.2 % (ref 0.0–3.0)
Eosinophils Absolute: 0.2 K/uL (ref 0.0–0.7)
Eosinophils Relative: 2.2 % (ref 0.0–5.0)
HCT: 44.4 % (ref 39.0–52.0)
Hemoglobin: 15.2 g/dL (ref 13.0–17.0)
Lymphocytes Relative: 33.4 % (ref 12.0–46.0)
Lymphs Abs: 2.9 K/uL (ref 0.7–4.0)
MCHC: 34.2 g/dL (ref 30.0–36.0)
MCV: 93.1 fl (ref 78.0–100.0)
Monocytes Absolute: 0.5 K/uL (ref 0.1–1.0)
Monocytes Relative: 5.7 % (ref 3.0–12.0)
Neutro Abs: 5.1 K/uL (ref 1.4–7.7)
Neutrophils Relative %: 58.5 % (ref 43.0–77.0)
Platelets: 247 K/uL (ref 150.0–400.0)
RBC: 4.77 Mil/uL (ref 4.22–5.81)
RDW: 13.1 % (ref 11.5–15.5)
WBC: 8.7 K/uL (ref 4.0–10.5)

## 2024-06-28 LAB — COMPREHENSIVE METABOLIC PANEL WITH GFR
ALT: 47 U/L (ref 0–53)
AST: 36 U/L (ref 0–37)
Albumin: 4 g/dL (ref 3.5–5.2)
Alkaline Phosphatase: 71 U/L (ref 39–117)
BUN: 16 mg/dL (ref 6–23)
CO2: 26 meq/L (ref 19–32)
Calcium: 8.8 mg/dL (ref 8.4–10.5)
Chloride: 103 meq/L (ref 96–112)
Creatinine, Ser: 0.94 mg/dL (ref 0.40–1.50)
GFR: 87.72 mL/min (ref >60.00)
Glucose, Bld: 165 mg/dL — ABNORMAL HIGH (ref 70–99)
Potassium: 4.2 meq/L (ref 3.5–5.1)
Sodium: 138 meq/L (ref 135–145)
Total Bilirubin: 0.4 mg/dL (ref 0.2–1.2)
Total Protein: 7.3 g/dL (ref 6.0–8.3)

## 2024-06-28 LAB — LIPID PANEL
Cholesterol: 166 mg/dL (ref 0–200)
HDL: 47.2 mg/dL (ref >39.00)
LDL Cholesterol: 78 mg/dL (ref 0–99)
NonHDL: 118.44
Total CHOL/HDL Ratio: 4
Triglycerides: 202 mg/dL — ABNORMAL HIGH (ref 0.0–149.0)
VLDL: 40.4 mg/dL — ABNORMAL HIGH (ref 0.0–40.0)

## 2024-06-28 LAB — PSA: PSA: 0.26 ng/mL (ref 0.10–4.00)

## 2024-06-28 LAB — TSH: TSH: 1.98 u[IU]/mL (ref 0.35–5.50)

## 2024-06-28 LAB — HEMOGLOBIN A1C: Hgb A1c MFr Bld: 6.3 % (ref 4.6–6.5)

## 2024-06-29 ENCOUNTER — Telehealth: Payer: Self-pay

## 2024-06-29 ENCOUNTER — Other Ambulatory Visit (HOSPITAL_COMMUNITY): Payer: Self-pay

## 2024-06-29 ENCOUNTER — Ambulatory Visit: Payer: Self-pay | Admitting: Nurse Practitioner

## 2024-06-29 DIAGNOSIS — E669 Obesity, unspecified: Secondary | ICD-10-CM

## 2024-06-29 MED ORDER — PHENTERMINE HCL 37.5 MG PO CAPS: 37.5000 mg | ORAL_CAPSULE | ORAL | 0 refills | Status: AC

## 2024-06-29 NOTE — Telephone Encounter (Signed)
 Pharmacy Patient Advocate Encounter  Received notification from [facility] that Prior Authorization for PHENTERMINE  37.5 has been DENIED.  Full denial letter will be uploaded to the media tab. See denial reason below.     PA #/Case ID/Reference #: # [id number]

## 2024-06-29 NOTE — Telephone Encounter (Signed)
 PA request has been Submitted. New Encounter has been or will be created for follow up. For additional info see Pharmacy Prior Auth telephone encounter from 06/29/24. Was in incorrect context.

## 2024-06-29 NOTE — Telephone Encounter (Signed)
 Pharmacy Patient Advocate Encounter   Received notification from Onbase that prior authorization for Phentermine  HCL 37.5 mg caps is required/requested.   Insurance verification completed.   The patient is insured through [facility] .   Per test claim: PA required; PA submitted to above mentioned insurance via Latent Key/confirmation #/EOC [id number] Status is pending

## 2024-07-02 ENCOUNTER — Other Ambulatory Visit (HOSPITAL_COMMUNITY): Payer: Self-pay

## 2024-07-02 DIAGNOSIS — R069 Unspecified abnormalities of breathing: Secondary | ICD-10-CM | POA: Diagnosis not present

## 2024-07-03 ENCOUNTER — Ambulatory Visit: Payer: Self-pay

## 2024-07-03 NOTE — Telephone Encounter (Signed)
 Called pt and informed him of denial letter. Advised pt to contact [facility] for the weight loss program.  Pt declined and stated he will have to think about this, but probably won't use it and will figure something else out.  No further questions or concerns.

## 2024-07-03 NOTE — Telephone Encounter (Signed)
 Can we let the patietn know that ihis insurance requires him to be in a comprehensive weight program for at least 6 months prior to covering the medications. We can give him  [facility]  Address: [address], [place], [place] [address] Hours:  Closes soon ? 5?PM ? Opens 7?AM Tue Confirmed by this business 7 weeks ago Phone: [phone]

## 2024-07-06 ENCOUNTER — Other Ambulatory Visit: Payer: Self-pay | Admitting: Nurse Practitioner

## 2024-07-06 DIAGNOSIS — J302 Other seasonal allergic rhinitis: Secondary | ICD-10-CM

## 2024-07-25 LAB — COLOGUARD: COLOGUARD: NEGATIVE

## 2024-07-27 ENCOUNTER — Ambulatory Visit: Admitting: Nurse Practitioner

## 2024-09-03 ENCOUNTER — Other Ambulatory Visit: Payer: Self-pay

## 2024-09-03 DIAGNOSIS — G4733 Obstructive sleep apnea (adult) (pediatric): Secondary | ICD-10-CM

## 2024-09-03 NOTE — Progress Notes (Signed)
 Pt had called to notify us  of wanting to proceed with ordering the CPAP machine, front desk took the message and notified us  as clinical to place the order. NFN.

## 2024-09-11 ENCOUNTER — Telehealth: Payer: Self-pay | Admitting: Sleep Medicine

## 2024-09-11 NOTE — Telephone Encounter (Signed)
 We received a note from [facility] the patient is unwilling to provide credit card information to proceed with setup/delivery. I am sending the order to [employer] to see if it works better for the patient

## 2024-12-06 ENCOUNTER — Encounter: Admitting: Nurse Practitioner
